# Patient Record
Sex: Male | Born: 1955 | Race: White | Hispanic: No | Marital: Married | Smoking: Never smoker
Health system: Southern US, Community
[De-identification: ages and names within clinical notes are randomized; demographics above are authoritative.]

## PROBLEM LIST (undated history)

## (undated) DIAGNOSIS — R7301 Impaired fasting glucose: Secondary | ICD-10-CM

## (undated) DIAGNOSIS — Z0389 Encounter for observation for other suspected diseases and conditions ruled out: Secondary | ICD-10-CM

## (undated) DIAGNOSIS — R0609 Other forms of dyspnea: Secondary | ICD-10-CM

## (undated) DIAGNOSIS — I10 Essential (primary) hypertension: Secondary | ICD-10-CM

## (undated) DIAGNOSIS — M489 Spondylopathy, unspecified: Secondary | ICD-10-CM

## (undated) DIAGNOSIS — J45909 Unspecified asthma, uncomplicated: Secondary | ICD-10-CM

## (undated) DIAGNOSIS — K589 Irritable bowel syndrome without diarrhea: Secondary | ICD-10-CM

## (undated) DIAGNOSIS — Z1589 Genetic susceptibility to other disease: Secondary | ICD-10-CM

## (undated) DIAGNOSIS — M545 Low back pain, unspecified: Secondary | ICD-10-CM

## (undated) DIAGNOSIS — B0229 Other postherpetic nervous system involvement: Secondary | ICD-10-CM

## (undated) DIAGNOSIS — G4700 Insomnia, unspecified: Secondary | ICD-10-CM

## (undated) DIAGNOSIS — E7800 Pure hypercholesterolemia, unspecified: Secondary | ICD-10-CM

## (undated) DIAGNOSIS — G4730 Sleep apnea, unspecified: Secondary | ICD-10-CM

## (undated) DIAGNOSIS — R202 Paresthesia of skin: Secondary | ICD-10-CM

## (undated) DIAGNOSIS — R42 Dizziness and giddiness: Secondary | ICD-10-CM

## (undated) DIAGNOSIS — IMO0001 Reserved for inherently not codable concepts without codable children: Secondary | ICD-10-CM

## (undated) DIAGNOSIS — K649 Unspecified hemorrhoids: Secondary | ICD-10-CM

## (undated) DIAGNOSIS — M5430 Sciatica, unspecified side: Secondary | ICD-10-CM

## (undated) DIAGNOSIS — Z8679 Personal history of other diseases of the circulatory system: Secondary | ICD-10-CM

## (undated) DIAGNOSIS — E559 Vitamin D deficiency, unspecified: Secondary | ICD-10-CM

## (undated) DIAGNOSIS — K5790 Diverticulosis of intestine, part unspecified, without perforation or abscess without bleeding: Secondary | ICD-10-CM

## (undated) DIAGNOSIS — I491 Atrial premature depolarization: Secondary | ICD-10-CM

## (undated) DIAGNOSIS — G4733 Obstructive sleep apnea (adult) (pediatric): Secondary | ICD-10-CM

## (undated) DIAGNOSIS — I499 Cardiac arrhythmia, unspecified: Secondary | ICD-10-CM

## (undated) DIAGNOSIS — J309 Allergic rhinitis, unspecified: Secondary | ICD-10-CM

## (undated) DIAGNOSIS — N400 Enlarged prostate without lower urinary tract symptoms: Secondary | ICD-10-CM

## (undated) DIAGNOSIS — L570 Actinic keratosis: Secondary | ICD-10-CM

## (undated) HISTORY — DX: Irritable bowel syndrome, unspecified: K58.9

## (undated) HISTORY — DX: Allergic rhinitis, unspecified: J30.9

## (undated) HISTORY — DX: Sciatica, unspecified side: M54.30

## (undated) HISTORY — DX: Unspecified asthma, uncomplicated: J45.909

## (undated) HISTORY — PX: SHOULDER SURGERY: SHX246

## (undated) HISTORY — DX: Other postherpetic nervous system involvement: B02.29

## (undated) HISTORY — PX: HERNIA REPAIR: SHX51

## (undated) HISTORY — DX: Vitamin D deficiency, unspecified: E55.9

## (undated) HISTORY — DX: Paresthesia of skin: R20.2

## (undated) HISTORY — DX: Pure hypercholesterolemia, unspecified: E78.00

## (undated) HISTORY — DX: Personal history of other diseases of the circulatory system: Z86.79

## (undated) HISTORY — DX: Low back pain, unspecified: M54.50

## (undated) HISTORY — DX: Atrial premature depolarization: I49.1

## (undated) HISTORY — DX: Other forms of dyspnea: R06.09

## (undated) HISTORY — DX: Cardiac arrhythmia, unspecified: I49.9

## (undated) HISTORY — DX: Insomnia, unspecified: G47.00

## (undated) HISTORY — DX: Encounter for observation for other suspected diseases and conditions ruled out: Z03.89

## (undated) HISTORY — DX: Genetic susceptibility to other disease: Z15.89

## (undated) HISTORY — DX: Diverticulosis of intestine, part unspecified, without perforation or abscess without bleeding: K57.90

## (undated) HISTORY — DX: Spondylopathy, unspecified: M48.9

## (undated) HISTORY — DX: Unspecified hemorrhoids: K64.9

## (undated) HISTORY — DX: Impaired fasting glucose: R73.01

## (undated) HISTORY — PX: KNEE SURGERY: SHX244

## (undated) HISTORY — DX: Essential (primary) hypertension: I10

## (undated) HISTORY — DX: Benign prostatic hyperplasia without lower urinary tract symptoms: N40.0

## (undated) HISTORY — DX: Dizziness and giddiness: R42

## (undated) HISTORY — DX: Actinic keratosis: L57.0

## (undated) HISTORY — DX: Obstructive sleep apnea (adult) (pediatric): G47.33

## (undated) HISTORY — DX: Sleep apnea, unspecified: G47.30

## (undated) HISTORY — DX: Reserved for inherently not codable concepts without codable children: IMO0001

---

## 1998-10-14 ENCOUNTER — Ambulatory Visit (HOSPITAL_COMMUNITY): Admission: RE | Admit: 1998-10-14 | Discharge: 1998-10-14 | Payer: Self-pay | Admitting: Family Medicine

## 1998-10-14 ENCOUNTER — Encounter: Payer: Self-pay | Admitting: Family Medicine

## 2000-04-17 ENCOUNTER — Encounter: Admission: RE | Admit: 2000-04-17 | Discharge: 2000-05-13 | Payer: Self-pay | Admitting: Family Medicine

## 2000-11-27 ENCOUNTER — Encounter: Admission: RE | Admit: 2000-11-27 | Discharge: 2001-02-25 | Payer: Self-pay | Admitting: *Deleted

## 2001-07-08 ENCOUNTER — Ambulatory Visit (HOSPITAL_COMMUNITY): Admission: RE | Admit: 2001-07-08 | Discharge: 2001-07-08 | Payer: Self-pay | Admitting: Gastroenterology

## 2001-07-08 ENCOUNTER — Encounter (INDEPENDENT_AMBULATORY_CARE_PROVIDER_SITE_OTHER): Payer: Self-pay | Admitting: *Deleted

## 2001-07-17 ENCOUNTER — Encounter: Payer: Self-pay | Admitting: Specialist

## 2001-07-17 ENCOUNTER — Ambulatory Visit (HOSPITAL_COMMUNITY): Admission: RE | Admit: 2001-07-17 | Discharge: 2001-07-17 | Payer: Self-pay | Admitting: Specialist

## 2002-07-04 ENCOUNTER — Ambulatory Visit (HOSPITAL_BASED_OUTPATIENT_CLINIC_OR_DEPARTMENT_OTHER): Admission: RE | Admit: 2002-07-04 | Discharge: 2002-07-04 | Payer: Self-pay | Admitting: *Deleted

## 2002-08-14 ENCOUNTER — Ambulatory Visit (HOSPITAL_BASED_OUTPATIENT_CLINIC_OR_DEPARTMENT_OTHER): Admission: RE | Admit: 2002-08-14 | Discharge: 2002-08-14 | Payer: Self-pay | Admitting: *Deleted

## 2002-12-08 ENCOUNTER — Emergency Department (HOSPITAL_COMMUNITY): Admission: EM | Admit: 2002-12-08 | Discharge: 2002-12-09 | Payer: Self-pay | Admitting: Emergency Medicine

## 2006-04-17 ENCOUNTER — Encounter: Admission: RE | Admit: 2006-04-17 | Discharge: 2006-04-17 | Payer: Self-pay | Admitting: Otolaryngology

## 2007-04-22 ENCOUNTER — Encounter: Admission: RE | Admit: 2007-04-22 | Discharge: 2007-04-22 | Payer: Self-pay | Admitting: Orthopedic Surgery

## 2007-06-27 ENCOUNTER — Other Ambulatory Visit: Admission: RE | Admit: 2007-06-27 | Discharge: 2007-06-27 | Payer: Self-pay | Admitting: Interventional Radiology

## 2007-06-27 ENCOUNTER — Encounter: Admission: RE | Admit: 2007-06-27 | Discharge: 2007-06-27 | Payer: Self-pay | Admitting: Surgery

## 2007-06-27 ENCOUNTER — Encounter (INDEPENDENT_AMBULATORY_CARE_PROVIDER_SITE_OTHER): Payer: Self-pay | Admitting: Interventional Radiology

## 2008-03-11 ENCOUNTER — Encounter: Admission: RE | Admit: 2008-03-11 | Discharge: 2008-04-14 | Payer: Self-pay | Admitting: Family Medicine

## 2008-10-07 ENCOUNTER — Encounter: Payer: Self-pay | Admitting: Emergency Medicine

## 2008-10-07 ENCOUNTER — Inpatient Hospital Stay (HOSPITAL_COMMUNITY): Admission: EM | Admit: 2008-10-07 | Discharge: 2008-10-08 | Payer: Self-pay | Admitting: Cardiology

## 2008-10-07 ENCOUNTER — Ambulatory Visit: Payer: Self-pay | Admitting: Diagnostic Radiology

## 2008-10-15 ENCOUNTER — Encounter: Admission: RE | Admit: 2008-10-15 | Discharge: 2008-10-15 | Payer: Self-pay | Admitting: Gastroenterology

## 2009-03-10 ENCOUNTER — Encounter: Admission: RE | Admit: 2009-03-10 | Discharge: 2009-03-24 | Payer: Self-pay | Admitting: Family Medicine

## 2009-10-04 ENCOUNTER — Encounter: Admission: RE | Admit: 2009-10-04 | Discharge: 2009-10-04 | Payer: Self-pay | Admitting: Orthopedic Surgery

## 2010-04-05 ENCOUNTER — Encounter: Admission: RE | Admit: 2010-04-05 | Discharge: 2010-04-05 | Payer: Self-pay | Admitting: Rheumatology

## 2010-10-18 LAB — COMPREHENSIVE METABOLIC PANEL WITH GFR
ALT: 51 U/L (ref 0–53)
AST: 43 U/L — ABNORMAL HIGH (ref 0–37)
Albumin: 4.2 g/dL (ref 3.5–5.2)
GFR calc Af Amer: >60 mL/min (ref >60)
Potassium: 4.2 meq/L (ref 3.5–5.1)
Total Bilirubin: 0.8 mg/dL (ref 0.3–1.2)
Total Protein: 7.4 g/dL (ref 6.0–8.3)

## 2010-10-18 LAB — DIFFERENTIAL
Basophils Absolute: 0.1 10*3/uL (ref 0.0–0.1)
Basophils Relative: 1 % (ref 0–1)
Eosinophils Absolute: 0.1 10*3/uL (ref 0.0–0.7)
Eosinophils Relative: 1 % (ref 0–5)
Lymphocytes Relative: 20 % (ref 12–46)
Lymphs Abs: 1.5 10*3/uL (ref 0.7–4.0)
Monocytes Absolute: 0.7 10*3/uL (ref 0.1–1.0)
Monocytes Relative: 10 % (ref 3–12)
Neutro Abs: 4.9 10*3/uL (ref 1.7–7.7)
Neutrophils Relative %: 68 % (ref 43–77)

## 2010-10-18 LAB — BASIC METABOLIC PANEL
BUN: 13 mg/dL (ref 6–23)
CO2: 27 mEq/L (ref 19–32)
Calcium: 9 mg/dL (ref 8.4–10.5)
Chloride: 107 mEq/L (ref 96–112)
GFR calc Af Amer: >60 mL/min (ref >60)
GFR calc non Af Amer: >60 mL/min (ref >60)
Glucose, Bld: 100 mg/dL — ABNORMAL HIGH (ref 70–99)

## 2010-10-18 LAB — CARDIAC PANEL(CRET KIN+CKTOT+MB+TROPI)
CK, MB: 5 ng/mL — ABNORMAL HIGH (ref 0.3–4.0)
Relative Index: 2.7 — ABNORMAL HIGH (ref 0.0–2.5)
Relative Index: INVALID (ref 0.0–2.5)
Total CK: 121 U/L (ref 7–232)
Total CK: 83 U/L (ref 7–232)
Troponin I: <0.01 ng/mL (ref 0.00–0.06)
Troponin I: <0.01 ng/mL (ref 0.00–0.06)

## 2010-10-18 LAB — COMPREHENSIVE METABOLIC PANEL
Alkaline Phosphatase: 49 U/L (ref 39–117)
BUN: 12 mg/dL (ref 6–23)
CO2: 25 mEq/L (ref 19–32)
Calcium: 9.1 mg/dL (ref 8.4–10.5)
Chloride: 107 mEq/L (ref 96–112)
Creatinine, Ser: 0.8 mg/dL (ref 0.4–1.5)
GFR calc non Af Amer: >60 mL/min (ref >60)
Glucose, Bld: 100 mg/dL — ABNORMAL HIGH (ref 70–99)
Sodium: 141 mEq/L (ref 135–145)

## 2010-10-18 LAB — POCT CARDIAC MARKERS
CKMB, poc: 4.2 ng/mL (ref 1.0–8.0)
Myoglobin, poc: 70.1 ng/mL (ref 12–200)
Troponin i, poc: <0.05 ng/mL (ref 0.00–0.09)

## 2010-10-18 LAB — CBC
HCT: 39.6 % (ref 39.0–52.0)
HCT: 44.2 % (ref 39.0–52.0)
Hemoglobin: 14.9 g/dL (ref 13.0–17.0)
MCHC: 33.6 g/dL (ref 30.0–36.0)
MCV: 88.4 fL (ref 78.0–100.0)
MCV: 88.7 fL (ref 78.0–100.0)
Platelets: 241 10*3/uL (ref 150–400)
Platelets: 263 K/uL (ref 150–400)
RBC: 4.99 MIL/uL (ref 4.22–5.81)
RDW: 13.6 % (ref 11.5–15.5)
RDW: 12.9 % (ref 11.5–15.5)
WBC: 8.6 10*3/uL (ref 4.0–10.5)
WBC: 7.3 K/uL (ref 4.0–10.5)

## 2010-10-18 LAB — LIPID PANEL
HDL: 33 mg/dL — ABNORMAL LOW (ref >39)
LDL Cholesterol: 94 mg/dL (ref 0–99)
VLDL: 25 mg/dL (ref 0–40)

## 2010-10-18 LAB — APTT: aPTT: 28 s (ref 24–37)

## 2010-10-18 LAB — PROTIME-INR
INR: 1 (ref 0.00–1.49)
Prothrombin Time: 12.8 s (ref 11.6–15.2)

## 2010-10-18 LAB — LIPASE, BLOOD: Lipase: 125 U/L (ref 23–300)

## 2010-11-21 NOTE — H&P (Signed)
[name], [name] NO.:  [id number]   MEDICAL RECORD NO.:  [id number]          PATIENT TYPE:  EMS   LOCATION:  ED                            FACILITY:  [facility]   PHYSICIAN:  Jake Bathe, MD      DATE OF BIRTH:  1955-08-21   DATE OF ADMISSION:  10/07/2008  DATE OF DISCHARGE:  10/07/2008                              HISTORY & PHYSICAL   CARDIOLOGIST:  Francisca December, MD   PRIMARY CARE PHYSICIAN:  Chales Salmon. Douglas Miyamoto, MD   CHIEF COMPLAINT:  Chest pain.   HISTORY OF PRESENT ILLNESS:  A 55 year old male with prior history of  chest pain and in 2004 had a nuclear stress test which showed an  anteroseptal wall reversible defect from base to apex and subsequently  had a cardiac catheterization in 2004 which demonstrated no  angiographically significant coronary artery disease who has a past  medical history of hypertension who is being admitted with chest pain.  Last week at the end of work, he started to develop substernal chest  pain which radiated across his entire chest wall which eventually  worsened in severity to a grade 6/10 and died out at approximately 1 to  2 a.m. that night.  He did not seek medical attention at that time  although he was very close to going to the emergency department close to  his home.  The next day he did have another bout of chest pain although  not quite as severe.  He then saw his allergist, Dr. Corinda Gubler in case  this was related to his asthma and Dr. Corinda Gubler did not feel as though  that was the case.  He then saw Dr. Henrine Screws who set him up for an  appointment at [facility] for further evaluation later next week.  He was prescribed Zegerid for possible GERD.   Earlier today he once again had another bout of substernal chest  discomfort and felt as though he had to sigh to complete a deep breath.  No diaphoresis.  He did have some associated jaw pain with this however  that was different from his prior bouts.  This  worried him enough to  proceed to the [place] [facility] emergency department.  While there,  he had a chest x-ray which was unremarkable, ECG which showed no ST  changes just nonspecific ST-T wave changes.  First set of cardiac  biomarkers were unremarkable.  He also had a lipase which was normal.  All lab work otherwise was unremarkable.   He currently after being transferred to [facility] is chest  pain free, doing quite well.  He stated that after given his  nitroglycerin in the emergency department that he did feel better.   PAST MEDICAL HISTORY:  As described above:  1. Also elevated CK at one point seen by Dr. Coral Spikes of unknown      etiology.  2. Prior leukopenia of unknown etiology.  3. Low back pain, HLA-B27 antigen positive.  4. Hypertension.  5. BPH.  6. Vertigo/dizziness.  7. Irritable bowel syndrome.  8. Degenerative joint disease.  9. Obstructive sleep apnea wearing CPAP mask.   ALLERGIES:  No known drug allergies.   PAST SURGICAL HISTORY:  Hernia repair, knee surgery.   SOCIAL HISTORY:  He is a nonsmoker, rarely drinks alcohol.  He works as  a Medical illustrator.  Tilton Marsalis his mother at age 70 was seen once by me, Dr.  Anne Fu.   FAMILY HISTORY:  His mother had coronary artery disease at a later age  as well as stroke, otherwise no early family history of coronary artery  disease.   REVIEW OF SYSTEMS:  No fevers, no syncope, no rash at home and as stated  above all other 12 review of systems negative.   PHYSICAL EXAMINATION:  VITAL SIGNS:  Blood pressure 151/90 on arrival,  pulse 80, respirations 16, temperature 98.6 on room air 100%.  GENERAL:  Alert and oriented x3 in no acute distress, pleasant, walking  around room without any difficulty.  EYES:  Well-perfused conjunctivae.  EOMI.  No scleral icterus.  NECK: Supple.  No lymphadenopathy.  No JVD, no carotid bruits, full  range of motion of neck.  CARDIOVASCULAR:  Regular rate and rhythm without  any murmurs, rubs or  gallops.  Normal PMI.  LUNGS: Clear to auscultation bilaterally.  No wheezes.  No rales.  Normal respiratory effort.  ABDOMEN:  Mildly obese.  Positive bowel sounds, nontender.  No  hepatosplenomegaly.  No bruits.  EXTREMITIES:  No clubbing, cyanosis or edema.  Normal distal pulses.  NEUROLOGIC:  Nonfocal.  No tremors.  Normal gait.  SKIN:  Warm, dry, and intact.  No rashes.  PSYCH:  Normal affect.   LABORATORY DATA:  As described above unremarkable, reviewed.  AST 43,  ALT 51.  EKG chest x-ray personally reviewed as above.  Nonspecific ST-T  wave changes noted.  Cardiac catheterization from 2004 demonstrated no  angiographically significant coronary artery disease with normal  ejection fraction.   ASSESSMENT AND PLAN:  A 55 year old male with hypertension, mild obesity  with chest pain of increasing frequency concerning for unstable angina.  1. Unstable angina - given his increasing chest pain, frequency as      well as radiation to his jaw we will go ahead and treat his      unstable angina and place him on subcutaneous Lovenox, aspirin, low-      dose beta-blocker 25 mg twice a day, simvastatin 40 mg once a day,      aspirin, oxygen, nitroglycerin as needed.  Certainly, there are      some atypical features to his story however, many of these features      are consistent with angina.  We will continue to cycle cardiac      enzymes overnight.  We will make n.p.o. for morning in case further      procedure is needed.  Depending on the patient's symptoms, EKG in      the morning as well as cardiac biomarkers, we will proceed with      either cardiac catheterization or possible repeat nuclear stress      test.  We will leave the ultimate decision to Dr. Mayford Knife tomorrow      morning.  2. Gastroesophageal reflux disease - start on Protonix here.  3. Hypertension - continue Altace adding low-dose beta-blocker.  4. Benign prostatic hypertrophy - continue Flomax.   5. Obstructive sleep apnea - he says that his wife will bring his CPAP      mask from home.  Jake Bathe, MD  Electronically Signed     MCS/MEDQ  D:  10/07/2008  T:  10/08/2008  Job:  [id number]   cc:   Chales Salmon. [name], M.D.  Francisca December, M.D.

## 2010-11-21 NOTE — Cardiovascular Report (Signed)
[name], [name] NO.:  [id number]   MEDICAL RECORD NO.:  [id number]          PATIENT TYPE:  INP   LOCATION:  2858                         FACILITY:  [facility]   PHYSICIAN:  Armanda Magic, M.D.     DATE OF BIRTH:  01-28-1956   DATE OF PROCEDURE:  10/08/2008  DATE OF DISCHARGE:  10/08/2008                            CARDIAC CATHETERIZATION   REFERRING PHYSICIAN:  Chales Salmon. Abigail Miyamoto, MD   PROCEDURES:  1. Left heart catheterization.  2. Coronary angiography.  3. Left ventriculography.   OPERATOR:  Armanda Magic, MD   INDICATION:  Chest pain.   COMPLICATIONS:  None.   INTRAVENOUS MEDICATIONS:  1. Versed 1 mg.  2. Fentanyl 25 mcg.   This is a 55 year old male, who presented with an episode of chest pain  with nonspecific ST changes.  Because of continued chest pain during the  night, he now presents for cardiac catheterization.   The patient was brought to cardiac catheterization laboratory in a  fasting nonsedated state.  Informed consent was obtained.  The patient  was connected to continuous heart rate and pulse oximetry monitoring and  blood pressure monitor.  The right groin was prepped and draped in  sterile fashion.  A 1% Xylocaine was used for local anesthesia.  Using  the modified Seldinger technique, a 6-French sheath was placed in the  right femoral artery.  Under fluoroscopic guidance, a 6-French JL4  catheter was placed in the left coronary artery.  Multiple cine films  were taken in 30-degree RAO and 40-degree LAO views.  This catheter was  then exchanged out over a guidewire for a 6-French JR4 catheter, which  was placed under fluoroscopic guidance in to the right coronary artery.  Multiple cine films were taken in 30-degree RAO and 40-degree LAO views.  This catheter was then exchanged out over a guidewire for a 6-French  angled pigtail catheter, which was placed under fluoroscopic guidance in  to the left ventricular cavity.  Left  ventriculography was performed in  the 30-degree RAO view using total of 30 mL of contrast at 15 mL/second.  The catheter was then pulled back across the aortic valve with no  significant gradient noted.  At the end of the procedure, all catheters  and sheaths were removed.  Manual compression was performed and adequate  hemostasis was obtained.  The patient was transferred back to room in  stable condition.   The left main coronary artery is widely patent and bifurcates into left  anterior descending artery and left circumflex artery.  The left  anterior descending artery is widely patent throughout its course to the  apex.  It gives rise to a first diagonal branch, which is moderate in  size and widely patent.  It then gives rise to a second diagonal branch,  again moderate in size and widely patent.   Left circumflex traverses the AV groove and is widely patent throughout  its course.  It gives rise to a very high obtuse marginal 1 branch,  which is a large vessel and is widely patent and gives rise to a second  moderate-sized obtuse  marginal 2 branch, which is widely patent and then  distally gives rise to a third obtuse marginal branch, which is widely  patent.   The right coronary artery is widely patent throughout its course and  distally bifurcates into a posterior descending artery and  posterolateral artery, both of which are widely patent.   Left ventriculography shows normal LV function, EF 60% with no regional  wall motion abnormalities.  LV pressure 124/2 mmHg, aortic pressure  113/69 mmHg.   ASSESSMENT:  1. Noncardiac chest pain.  2. Normal coronary arteries.  3. Normal left ventricular function.   PLAN:  Discharge to home after IV fluid and bedrest.  Follow up with Dr.  Matthias Hughs, needs upper endoscopy to rule out possible esophageal spasm,  continue Zegerid.      Armanda Magic, M.D.  Electronically Signed     Armanda Magic, M.D.  Electronically  Signed    TT/MEDQ  D:  02/10/2009  T:  02/11/2009  Job:  [id number]   cc:   Bernette Redbird, M.D.  Dr. Darrick Grinder. Abigail Miyamoto, M.D.

## 2010-11-24 NOTE — Procedures (Signed)
[place]  Patient:    [name], Douglas Visit Number: [id number] MRN: [id number]          Service Type: END Location: ENDO Attending Physician:  Rich Brave Dictated by:   Florencia Reasons, M.D. Proc. Date: 07/08/01 Admit Date:  07/08/2001   CC:         Al Decant. Janey Greaser, M.D.   Procedure Report  PROCEDURE:  Upper endoscopy with biopsies.  INDICATION:  Sporadic diarrhea in a 55 year old otherwise healthy gentleman.  FINDINGS:  Normal exam.  DESCRIPTION OF PROCEDURE:  The patient provided written consent.  Sedation was fentanyl 100 mcg and Versed 10 mg IV prior to and during the course of this procedure and the colonoscopy which followed it.  The Olympus video endoscope was passed under direct vision.  The larynx and vocal cords looked normal. The esophagus was endoscopically normal without evidence of reflux esophagitis, Barretts esophagus, varices, infection, or neoplasia.  There was a small hiatal hernia with a minimal esophageal ring present.  The stomach contained a small clear residual but had normal mucosa other than just a little bit of patchy erythema in the antral region, without erosions, ulcers, polyps, or masses, including a retroflex view of the proximal stomach.  The pylorus, duodenal bulb, and second duodenum looked normal.  The scope was advanced as far as possible into the duodenum, reaching what I believe was the third portion of the duodenum.  The biopsy forceps was then used to obtain multiple duodenal biopsies prior to removal of the scope.  The patient tolerated the procedure well, and there were no apparent complications.  IMPRESSION:  Endoscopically normal exam except for small hiatal hernia and minimal esophageal ring.  PLAN:  Await pathology on biopsies.  The mucosal appearance of the duodenum was normal, so no source of diarrhea was endoscopically evident. Dictated by:   Florencia Reasons,  M.D. Attending Physician:  Rich Brave DD:  07/08/01 TD:  07/08/01 Job: [id number]

## 2010-11-24 NOTE — Discharge Summary (Signed)
NAME:  [name], DARLEY NO.:  [id number]   MEDICAL RECORD NO.:  [id number]           PATIENT TYPE:   LOCATION:                                 FACILITY:   PHYSICIAN:  Armanda Magic, M.D.     DATE OF BIRTH:  1956/05/09   DATE OF ADMISSION:  DATE OF DISCHARGE:                               DISCHARGE SUMMARY   DISCHARGE DIAGNOSES:  1. Noncardiac chest pain.  2. Normal coronary arteries.  3. Normal ejection fraction.  4. Leukopenia.  5. Hypertension.  6  Benign prostatic hypertrophy.  1. Vertigo.  2. Irritable bowel syndrome.  3. Degenerative joint disease.  4. Sleep apnea.   HOSPITAL COURSE:  Mr. Pott is a 55 year old male patient who comes  into the hospital on October 07, 2008, with chest pain.  His cardiac  enzymes were normal.  Lab studies show hemoglobin of 14.9, hematocrit  39.6.  BUN 13, creatinine 0.88.  Ultimately, he did require cardiac  catheterization that showed widely patent vessels.  We thought his chest  pain was noncardiac in nature.  We asked him to follow up with Dr.  Matthias Hughs for possible endoscopy and a question of esophageal spasm.   DISCHARGE INSTRUCTIONS:  Remain on low-sodium heart-healthy diet.  Increase activity slowly.  No driving for 1 week.  No lifting over 10  pounds for 1 week.   DISCHARGE MEDICATIONS:  1. Altace 5 mg 1 a day.  2. Flomax 1 a day.  3. Clarinex p.r.n.  4. Zegerid per home dose.  5. Flonase 1 spray use nostril daily.  6. Astelin nasal spray as needed.   Follow up with Dr. Matthias Hughs.  The patient needs to call for this  appointment.  Follow up with Dr. Amil Amen in 2 weeks.      Guy Franco, P.A.      Armanda Magic, M.D.  Electronically Signed    LB/MEDQ  D:  12/10/2008  T:  12/11/2008  Job:  [id number]   cc:   Francisca December, M.D.  Chales Salmon. Abigail Miyamoto, M.D.  Bernette Redbird, M.D.

## 2012-02-13 ENCOUNTER — Other Ambulatory Visit: Payer: Self-pay | Admitting: Rheumatology

## 2012-02-13 DIAGNOSIS — M25559 Pain in unspecified hip: Secondary | ICD-10-CM

## 2012-02-16 ENCOUNTER — Other Ambulatory Visit: Payer: Self-pay

## 2012-04-18 ENCOUNTER — Ambulatory Visit: Payer: Commercial Indemnity | Attending: Family Medicine | Admitting: *Deleted

## 2012-04-18 DIAGNOSIS — H8110 Benign paroxysmal vertigo, unspecified ear: Secondary | ICD-10-CM | POA: Insufficient documentation

## 2012-04-18 DIAGNOSIS — IMO0001 Reserved for inherently not codable concepts without codable children: Secondary | ICD-10-CM | POA: Insufficient documentation

## 2012-04-21 ENCOUNTER — Ambulatory Visit: Payer: Commercial Indemnity | Admitting: Physical Therapy

## 2012-04-24 ENCOUNTER — Encounter: Payer: Commercial Indemnity | Admitting: Physical Therapy

## 2012-04-29 ENCOUNTER — Ambulatory Visit: Payer: Commercial Indemnity | Admitting: *Deleted

## 2012-05-01 ENCOUNTER — Ambulatory Visit: Payer: Commercial Indemnity | Admitting: *Deleted

## 2012-05-05 ENCOUNTER — Encounter: Payer: Commercial Indemnity | Admitting: *Deleted

## 2012-05-07 ENCOUNTER — Encounter: Payer: Commercial Indemnity | Admitting: *Deleted

## 2012-05-12 ENCOUNTER — Encounter: Payer: Commercial Indemnity | Admitting: *Deleted

## 2012-05-14 ENCOUNTER — Encounter: Payer: Commercial Indemnity | Admitting: *Deleted

## 2012-11-18 ENCOUNTER — Ambulatory Visit: Payer: Commercial Indemnity | Admitting: Rehabilitative and Restorative Service Providers"

## 2012-11-19 ENCOUNTER — Ambulatory Visit: Payer: Commercial Indemnity | Admitting: Rehabilitative and Restorative Service Providers"

## 2012-12-02 ENCOUNTER — Ambulatory Visit: Payer: Commercial Indemnity | Admitting: Rehabilitative and Restorative Service Providers"

## 2015-02-21 ENCOUNTER — Ambulatory Visit (INDEPENDENT_AMBULATORY_CARE_PROVIDER_SITE_OTHER): Payer: Commercial Indemnity | Admitting: Cardiovascular Disease

## 2015-02-21 ENCOUNTER — Encounter: Payer: Self-pay | Admitting: Cardiovascular Disease

## 2015-02-21 ENCOUNTER — Telehealth: Payer: Self-pay | Admitting: Cardiovascular Disease

## 2015-02-21 VITALS — BP 120/80 | HR 76 | Ht 70.0 in | Wt 216.2 lb

## 2015-02-21 DIAGNOSIS — R0600 Dyspnea, unspecified: Secondary | ICD-10-CM

## 2015-02-21 DIAGNOSIS — G4733 Obstructive sleep apnea (adult) (pediatric): Secondary | ICD-10-CM

## 2015-02-21 DIAGNOSIS — R0609 Other forms of dyspnea: Secondary | ICD-10-CM

## 2015-02-21 DIAGNOSIS — I10 Essential (primary) hypertension: Secondary | ICD-10-CM | POA: Insufficient documentation

## 2015-02-21 NOTE — Progress Notes (Signed)
02/21/2015 [name]   May 08, 1956  [id number]  Primary Physician  Douglas Lope, MD Primary Cardiologist: Douglas Gess MD [name]   HPI:  [name] is a delightful 59 year old mild to moderately overweight married Caucasian male father of 3 sons who works doing inside Airline pilot at [employer]. He was referred by Dr. Tenny Craw for cardiovascular evaluation because of mild dyspnea. His only cardiac risk factor is 2 hypertension. He's never had a heart stroke. He did have a clean coronary catheterization 5-6 years ago performed by Dr.  Ty [name] for atypical chest pain. He has occasional atypical chest pain now. His major complaint plate is mild dyspnea on walking up stairs. He does check his blood pressure, runs in the 130/70-80 range.   Current Outpatient Prescriptions  Medication Sig Dispense Refill  . cyclobenzaprine (FLEXERIL) 10 MG tablet Take 10 mg by mouth 3 (three) times daily as needed for muscle spasms.    Marland Kitchen gabapentin (NEURONTIN) 300 MG capsule Take 300 mg by mouth 3 (three) times daily.    . meloxicam (MOBIC) 7.5 MG tablet Take 7.5 mg by mouth 2 (two) times daily.    . ramipril (ALTACE) 5 MG capsule Take 5 mg by mouth daily.    . tamsulosin (FLOMAX) 0.4 MG CAPS capsule Take 0.8 mg by mouth at bedtime.    . zaleplon (SONATA) 10 MG capsule Take 10 mg by mouth at bedtime as needed for sleep.     No current facility-administered medications for this visit.    No Known Allergies  Social History   Social History  . Marital Status: Married    Spouse Name: N/A  . Number of Children: N/A  . Years of Education: N/A   Occupational History  . Not on file.   Social History Main Topics  . Smoking status: Never Smoker   . Smokeless tobacco: Never Used  . Alcohol Use: 0.6 oz/week    1 Standard drinks or equivalent per week  . Drug Use: No  . Sexual Activity: Not on file   Other Topics Concern  . Not on file   Social History Narrative  . No narrative on  file     Review of Systems: General: negative for chills, fever, night sweats or weight changes.  Cardiovascular: negative for chest pain, dyspnea on exertion, edema, orthopnea, palpitations, paroxysmal nocturnal dyspnea or shortness of breath Dermatological: negative for rash Respiratory: negative for cough or wheezing Urologic: negative for hematuria Abdominal: negative for nausea, vomiting, diarrhea, bright red blood per rectum, melena, or hematemesis Neurologic: negative for visual changes, syncope, or dizziness All other systems reviewed and are otherwise negative except as noted above.    Blood pressure 120/80, pulse 76, height 5\' 10"  (1.778 m), weight 216 lb 4 oz (98.09 kg).  General appearance: alert and no distress Neck: no adenopathy, no carotid bruit, no JVD, supple, symmetrical, trachea midline and thyroid not enlarged, symmetric, no tenderness/mass/nodules Lungs: clear to auscultation bilaterally Heart: regular rate and rhythm, S1, S2 normal, no murmur, click, rub or gallop Extremities: extremities normal, atraumatic, no cyanosis or edema  EKG normal sinus rhythm at 76 without ST or T-wave changes. I personally reviewed this EKG  ASSESSMENT AND PLAN:   Essential hypertension History of hypertension with blood pressure measures 120/80 on ramipril. Continue current meds at current dosing  Dyspnea on exertion Patient claims of mild dyspnea on exertion. He is not a smoker. He is mildly overweight. He had a clean coronary cast 5-6 years  ago performed by Dr. Ty [name]. He has no other cardiac risk factors. I do not think his dyspnea is ischemically mediated but more likely related to deconditioning and being mildly overweight. No further cardiovascular evaluation is warranted at this time.      Douglas Gess MD FACP,FACC,FAHA, [facility] 02/21/2015 1:55 PM

## 2015-02-21 NOTE — Assessment & Plan Note (Signed)
History of obstructive sleep apnea on C Pap for many years which he benefits from.

## 2015-02-21 NOTE — Patient Instructions (Signed)
Dr Berry has recommended that you follow-up with him as needed. 

## 2015-02-21 NOTE — Telephone Encounter (Signed)
Received records from [facility] [employer] for appointment on 02/21/15 with Dr Berry.  Records given to N Hines (medical records) for Dr Berry's schedule on 02/21/15. lp °

## 2015-02-21 NOTE — Assessment & Plan Note (Signed)
Patient claims of mild dyspnea on exertion. He is not a smoker. He is mildly overweight. He had a clean coronary cast 5-6 years ago performed by Dr. Ty Hilts. He has no other cardiac risk factors. I do not think his dyspnea is ischemically mediated but more likely related to deconditioning and being mildly overweight. No further cardiovascular evaluation is warranted at this time.

## 2015-02-21 NOTE — Assessment & Plan Note (Signed)
History of hypertension with blood pressure measures 120/80 on ramipril. Continue current meds at current dosing

## 2015-03-09 ENCOUNTER — Ambulatory Visit: Payer: Commercial Indemnity | Admitting: Cardiology

## 2015-08-18 ENCOUNTER — Ambulatory Visit: Payer: Managed Care, Other (non HMO) | Attending: Family Medicine | Admitting: Physical Therapy

## 2015-08-18 ENCOUNTER — Encounter: Payer: Self-pay | Admitting: Physical Therapy

## 2015-08-18 DIAGNOSIS — H8111 Benign paroxysmal vertigo, right ear: Secondary | ICD-10-CM

## 2015-08-21 NOTE — Therapy (Signed)
[facility] [address] [place], [place], [address] Phone: [phone]   Fax:  [phone]  Physical Therapy Evaluation  Patient Details  Name: CARROLL LINGELBACH MRN: [id number] Date of Birth: 27-Jun-1956 Referring Provider: Dr. Gildardo Cranker  Encounter Date: 08/18/2015      PT End of Session - 08/21/15 2020    Visit Number 1   Number of Visits 4   Date for PT Re-Evaluation 09/18/15   Authorization Type [employer]   PT Start Time 1535   PT Stop Time 1621   PT Time Calculation (min) 46 min      Past Medical History  Diagnosis Date  . Hypertension   . Normal coronary arteries     by cardiac catheterization performed by Dr. Danise Mina approximately 5-6 years ago    Past Surgical History  Procedure Laterality Date  . Knee surgery      4 surgeries  . Shoulder surgery      both   . Hernia repair      2 ingunial, 1 in belly button    There were no vitals filed for this visit.  Visit Diagnosis:  BPPV (benign paroxysmal positional vertigo), right - Plan: PT plan of care cert/re-cert      Subjective Assessment - 08/21/15 2016    Subjective Vertigo started Friday night when he rolled over in bed   Pertinent History h/o BPPV episodes with most recent one in 2014    Patient Stated Goals Resolve the vertigo   Currently in Pain? No/denies            [facility] PT Assessment - 08/21/15 0001    Assessment   Medical Diagnosis BPPV   Referring Provider Dr. Gildardo Cranker   Onset Date/Surgical Date 08/12/15   Prior Therapy --  in 2014 for BPPV - at this facility   Balance Screen   Has the patient fallen in the past 6 months No   Has the patient had a decrease in activity level because of a fear of falling?  No   Is the patient reluctant to leave their home because of a fear of falling?  No      Pt has (+) R horizontal roll test - with R horizontal geotropic nystagmus Pt had horizontal nystagmus with R sidelying  test  Pt was treated with Bar-B-Que roll  X 2 reps for R horizontal canal BPPV  Instructed in habituation exercises - rolling- for R BPPV                     PT Education - 08/21/15 2019    Education provided Yes   Education Details rolling repetitively   [employer]) Educated Patient   Methods Explanation;Demonstration;Handout   Comprehension Verbalized understanding;Returned demonstration             PT Long Term Goals - 08/21/15 2023    PT LONG TERM GOAL #1   Title Pt will report no vertigo with any bed mobility.  09-18-15   Time 4   Period Weeks   Status New   PT LONG TERM GOAL #2   Title Pt will have (-) R horizontal roll test to indicate that R BPPV has resolved.  (09-18-15)   Time 4   Period Weeks   Status New   PT LONG TERM GOAL #3   Title Independent in HEP for habituation prn.  (09-18-15)   Time 4   Period Weeks   Status New  Plan - 08/21/15 2021    Clinical Impression Statement Pt has signs and symptoms consistent with R horizontal canalithiasis   Pt will benefit from skilled therapeutic intervention in order to improve on the following deficits Dizziness   Rehab Potential Good   PT Frequency 2x / week   PT Duration 2 weeks   PT Treatment/Interventions ADLs/Self Care Home Management;Canalith Repostioning;Therapeutic exercise;Therapeutic activities;Functional mobility training;Neuromuscular re-education;Patient/family education   PT Next Visit Plan recheck BPPV - R horizontal canal   PT Home Exercise Plan rolling   Consulted and Agree with Plan of Care Patient         Problem List Patient Active Problem List   Diagnosis Date Noted  . Essential hypertension 02/21/2015  . Dyspnea on exertion 02/21/2015  . Obstructive sleep apnea 02/21/2015    Kary Kos, PT 08/21/2015, 8:30 PM  [facility] [address] [place], [place], [address] Phone:  [phone]   Fax:  [phone]  Name: EHTAN DELFAVERO MRN: [id number] Date of Birth: September 14, 1955

## 2015-08-21 NOTE — Patient Instructions (Signed)
Rolling    With pillow under head, start on back. Roll slowly to right. Hold position until symptoms subside. Roll slowly onto left side. Hold position until symptoms subside. Repeat sequence __5_ times per session. Do __3__ sessions per day.  Copyright  VHI. All rights reserved.   

## 2015-08-22 ENCOUNTER — Ambulatory Visit: Payer: Managed Care, Other (non HMO) | Admitting: Physical Therapy

## 2015-08-22 DIAGNOSIS — H8111 Benign paroxysmal vertigo, right ear: Secondary | ICD-10-CM

## 2015-08-24 ENCOUNTER — Encounter: Payer: Self-pay | Admitting: Physical Therapy

## 2015-08-24 NOTE — Therapy (Signed)
[facility] [address] [place], [place], [address] Phone: [phone]   Fax:  [phone]  Physical Therapy Treatment  Patient Details  Name: [name] MRN: [id number] Date of Birth: August 21, 1955 Referring Provider: Dr. Lona Kettle  Encounter Date: 08/22/2015      PT End of Session - 08/24/15 1017    Visit Number 2   Number of Visits 4   Date for PT Re-Evaluation 09/18/15   Authorization Type [employer]   PT Start Time 8242   PT Stop Time 1655   PT Time Calculation (min) 34 min      Past Medical History  Diagnosis Date  . Hypertension   . Normal coronary arteries     by cardiac catheterization performed by Dr. Agapito Games approximately 5-6 years ago    Past Surgical History  Procedure Laterality Date  . Knee surgery      4 surgeries  . Shoulder surgery      both   . Hernia repair      2 ingunial, 1 in belly button    There were no vitals filed for this visit.  Visit Diagnosis:  BPPV (benign paroxysmal positional vertigo), right      Subjective Assessment - 08/24/15 1015    Subjective Pt states the vertigo is gone but he feels slightly "off balance"   Pertinent History h/o BPPV episodes with most recent one in 2014    Patient Stated Goals Resolve the vertigo   Currently in Pain? No/denies      Dynamic visual acuity test - 2 line difference (WNL's) - line 8 static - line 6 dynamic  Sensory Organization Test score WNL's for composite score of 74/100 (N= 70/100);  All 3 inputs - visual, vestibular, and Somatosensory WNL's Pt had no incr. C/o's vertigo after testing completed and pt stepped off machine without LOB  Pt has (-) horizontal roll test - indicative of resolution of R horizontal canal BPPV   Pt requests to be placed on HOLD x 30 days to determine if BPPV re-occurs - if not, pt will be discharged                      PT Education - 08/24/15 1016    Education  provided Yes   Education Details do gaze stabilization and balance on foam as previously instructed (pt has pictures from previous admission)   Person(s) Educated Patient   Methods Explanation;Demonstration   Comprehension Verbalized understanding             PT Long Term Goals - 08/24/15 1021    PT LONG TERM GOAL #1   Title Pt will report no vertigo with any bed mobility.  09-18-15   Baseline met 08-22-15   Status Achieved   PT LONG TERM GOAL #2   Title Pt will have (-) R horizontal roll test to indicate that R BPPV has resolved.  (09-18-15)   Baseline met 08-22-15   Status Achieved   PT LONG TERM GOAL #3   Title Independent in HEP for habituation prn.  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 - may be decompensated due  to BPPV   Pt will benefit from skilled therapeutic intervention in order to improve on the following deficits Dizziness   Rehab Potential Good   PT Frequency 2x / week   PT Duration 2 weeks   PT Treatment/Interventions ADLs/Self Care Home Management;Canalith Repostioning;Therapeutic exercise;Therapeutic activities;Functional mobility training;Neuromuscular re-education;Patient/family education   PT Next Visit Plan hold for 30 days per pt request - will D/C if another appt not needed   PT Home Exercise Plan gaze stabilization and balance on foam   Consulted and Agree with Plan of Care Patient        Problem List Patient Active Problem List   Diagnosis Date Noted  . Essential hypertension 02/21/2015  . Dyspnea on exertion 02/21/2015  . Obstructive sleep apnea 02/21/2015    Alda Lea, PT 08/24/2015, 10:23 AM  [facility] [address] [place], [place], [address] Phone: [phone]   Fax:  [phone]  Name: [name] MRN: [id number] Date of Birth: [birth year]

## 2017-07-15 DIAGNOSIS — M4690 Unspecified inflammatory spondylopathy, site unspecified: Secondary | ICD-10-CM | POA: Diagnosis not present

## 2017-07-15 DIAGNOSIS — Z1589 Genetic susceptibility to other disease: Secondary | ICD-10-CM | POA: Diagnosis not present

## 2017-07-15 DIAGNOSIS — M150 Primary generalized (osteo)arthritis: Secondary | ICD-10-CM | POA: Diagnosis not present

## 2017-07-15 DIAGNOSIS — R748 Abnormal levels of other serum enzymes: Secondary | ICD-10-CM | POA: Diagnosis not present

## 2017-07-23 DIAGNOSIS — M5417 Radiculopathy, lumbosacral region: Secondary | ICD-10-CM | POA: Diagnosis not present

## 2017-07-23 DIAGNOSIS — M545 Low back pain: Secondary | ICD-10-CM | POA: Diagnosis not present

## 2017-09-13 DIAGNOSIS — M1711 Unilateral primary osteoarthritis, right knee: Secondary | ICD-10-CM | POA: Diagnosis not present

## 2017-12-06 DIAGNOSIS — H6092 Unspecified otitis externa, left ear: Secondary | ICD-10-CM | POA: Diagnosis not present

## 2017-12-18 DIAGNOSIS — M1612 Unilateral primary osteoarthritis, left hip: Secondary | ICD-10-CM | POA: Diagnosis not present

## 2017-12-18 DIAGNOSIS — M545 Low back pain: Secondary | ICD-10-CM | POA: Diagnosis not present

## 2017-12-18 DIAGNOSIS — M5417 Radiculopathy, lumbosacral region: Secondary | ICD-10-CM | POA: Diagnosis not present

## 2017-12-18 DIAGNOSIS — M5136 Other intervertebral disc degeneration, lumbar region: Secondary | ICD-10-CM | POA: Diagnosis not present

## 2018-02-10 DIAGNOSIS — R748 Abnormal levels of other serum enzymes: Secondary | ICD-10-CM | POA: Diagnosis not present

## 2018-02-10 DIAGNOSIS — Z1589 Genetic susceptibility to other disease: Secondary | ICD-10-CM | POA: Diagnosis not present

## 2018-02-10 DIAGNOSIS — M4690 Unspecified inflammatory spondylopathy, site unspecified: Secondary | ICD-10-CM | POA: Diagnosis not present

## 2018-02-10 DIAGNOSIS — M150 Primary generalized (osteo)arthritis: Secondary | ICD-10-CM | POA: Diagnosis not present

## 2018-02-12 DIAGNOSIS — M1711 Unilateral primary osteoarthritis, right knee: Secondary | ICD-10-CM | POA: Diagnosis not present

## 2018-02-14 DIAGNOSIS — G4733 Obstructive sleep apnea (adult) (pediatric): Secondary | ICD-10-CM | POA: Diagnosis not present

## 2018-04-14 DIAGNOSIS — R42 Dizziness and giddiness: Secondary | ICD-10-CM | POA: Diagnosis not present

## 2018-04-14 DIAGNOSIS — J3489 Other specified disorders of nose and nasal sinuses: Secondary | ICD-10-CM | POA: Diagnosis not present

## 2018-04-14 DIAGNOSIS — Z9989 Dependence on other enabling machines and devices: Secondary | ICD-10-CM | POA: Diagnosis not present

## 2018-04-14 DIAGNOSIS — G4733 Obstructive sleep apnea (adult) (pediatric): Secondary | ICD-10-CM | POA: Diagnosis not present

## 2018-04-17 DIAGNOSIS — M25522 Pain in left elbow: Secondary | ICD-10-CM | POA: Diagnosis not present

## 2018-05-20 DIAGNOSIS — M25522 Pain in left elbow: Secondary | ICD-10-CM | POA: Diagnosis not present

## 2018-06-04 DIAGNOSIS — Z0000 Encounter for general adult medical examination without abnormal findings: Secondary | ICD-10-CM | POA: Diagnosis not present

## 2018-06-10 DIAGNOSIS — Z0000 Encounter for general adult medical examination without abnormal findings: Secondary | ICD-10-CM | POA: Diagnosis not present

## 2018-06-10 DIAGNOSIS — I10 Essential (primary) hypertension: Secondary | ICD-10-CM | POA: Diagnosis not present

## 2018-06-10 DIAGNOSIS — Z23 Encounter for immunization: Secondary | ICD-10-CM | POA: Diagnosis not present

## 2018-06-10 DIAGNOSIS — G4700 Insomnia, unspecified: Secondary | ICD-10-CM | POA: Diagnosis not present

## 2018-06-10 DIAGNOSIS — E7800 Pure hypercholesterolemia, unspecified: Secondary | ICD-10-CM | POA: Diagnosis not present

## 2018-06-10 DIAGNOSIS — E559 Vitamin D deficiency, unspecified: Secondary | ICD-10-CM | POA: Diagnosis not present

## 2018-06-30 DIAGNOSIS — M1711 Unilateral primary osteoarthritis, right knee: Secondary | ICD-10-CM | POA: Diagnosis not present

## 2018-07-16 DIAGNOSIS — H66002 Acute suppurative otitis media without spontaneous rupture of ear drum, left ear: Secondary | ICD-10-CM | POA: Diagnosis not present

## 2018-07-25 DIAGNOSIS — H9312 Tinnitus, left ear: Secondary | ICD-10-CM | POA: Diagnosis not present

## 2018-07-25 DIAGNOSIS — H9192 Unspecified hearing loss, left ear: Secondary | ICD-10-CM | POA: Diagnosis not present

## 2018-08-05 DIAGNOSIS — H9192 Unspecified hearing loss, left ear: Secondary | ICD-10-CM | POA: Diagnosis not present

## 2018-08-14 DIAGNOSIS — M150 Primary generalized (osteo)arthritis: Secondary | ICD-10-CM | POA: Diagnosis not present

## 2018-08-14 DIAGNOSIS — M4690 Unspecified inflammatory spondylopathy, site unspecified: Secondary | ICD-10-CM | POA: Diagnosis not present

## 2018-08-14 DIAGNOSIS — Z1589 Genetic susceptibility to other disease: Secondary | ICD-10-CM | POA: Diagnosis not present

## 2018-08-14 DIAGNOSIS — R748 Abnormal levels of other serum enzymes: Secondary | ICD-10-CM | POA: Diagnosis not present

## 2018-09-15 DIAGNOSIS — I499 Cardiac arrhythmia, unspecified: Secondary | ICD-10-CM | POA: Diagnosis not present

## 2018-12-10 DIAGNOSIS — G4700 Insomnia, unspecified: Secondary | ICD-10-CM | POA: Diagnosis not present

## 2018-12-30 DIAGNOSIS — M79602 Pain in left arm: Secondary | ICD-10-CM | POA: Diagnosis not present

## 2018-12-30 DIAGNOSIS — I10 Essential (primary) hypertension: Secondary | ICD-10-CM | POA: Diagnosis not present

## 2019-01-06 ENCOUNTER — Telehealth: Payer: Self-pay

## 2019-01-06 NOTE — Telephone Encounter (Signed)
NOTES ON FILE FROM DR ALAN ROSS [phone], SENT REFERRAL TO SCHEDULING 

## 2019-01-15 DIAGNOSIS — H43812 Vitreous degeneration, left eye: Secondary | ICD-10-CM | POA: Diagnosis not present

## 2019-01-28 NOTE — Progress Notes (Signed)
Cardiology Office Note   Date:  01/29/2019   ID:  [name], DOB 12-Jul-1955, MRN [id number]  PCP:  Daisy Florooss, Charles Alan, MD  Cardiologist:   Rollene RotundaJames Rayshawn Maney, MD Referring:  Daisy Florooss, Charles Alan, MD  Chief Complaint  Patient presents with  . Chest Pain  . Palpitations      History of Present Illness: [name] is a 63 y.o. male who was referred by Daisy Florooss, Charles Alan, MD for evaluation of chest pain.  He had a normal cath years ago.  He was seen by Dr. Allyson SabalBerry in 2016 for dyspnea which was not thought to be ischemic.  He gets occasional left shoulder pain.  This is in his left upper shoulder.  Is been happening probably for years.  Somewhat fleeting.  He cannot bring it on.  He does do some activities such as walking.  He occasionally rides a bike.  He does not bring on chest discomfort with this.  He is not having any neck discomfort.  He has no associated shortness of breath, PND or orthopnea.  He has had no presyncope or syncope.  He was concerned because he has had irregular heartbeats.  He feels like skipping beats.  It might skip a few times in a row.  Is been happening for a few months.  He has a baseline bradycardia which he is always wondered about.  He is not able to bring on the skipping beats.  He might happen with activity or at rest.  He is not describing sustained symptomatic tachyarrhythmias.  Past Medical History:  Diagnosis Date  . Hypertension   . Normal coronary arteries    by cardiac catheterization performed by Dr. Danise MinaJon Edmonds   . Sleep apnea     Past Surgical History:  Procedure Laterality Date  . HERNIA REPAIR     2 ingunial, 1 in belly button  . KNEE SURGERY     4 surgeries  . SHOULDER SURGERY     both      Current Outpatient Medications  Medication Sig Dispense Refill  . Cholecalciferol (VITAMIN D-1000 MAX ST) 25 MCG (1000 UT) tablet Take 1 tablet by mouth as directed.    . loperamide (IMODIUM A-D) 2 MG tablet Take 1 tablet by mouth  as directed.    . meloxicam (MOBIC) 7.5 MG tablet Take 7.5 mg by mouth 2 (two) times daily.    . ramipril (ALTACE) 5 MG capsule Take 5 mg by mouth daily.    . tamsulosin (FLOMAX) 0.4 MG CAPS capsule Take 0.8 mg by mouth at bedtime.    . zaleplon (SONATA) 10 MG capsule Take 10 mg by mouth at bedtime as needed for sleep.     No current facility-administered medications for this visit.     Allergies:   Patient has no known allergies.    Social History:  The patient  reports that he has never smoked. He has never used smokeless tobacco. He reports current alcohol use of about 1.0 standard drinks of alcohol per week. He reports that he does not use drugs.   Family History:  The patient's family history includes Cancer in his father; Diabetes in his maternal grandmother; Heart failure in his mother; Hypertension in his father; Stroke in his father and mother.    ROS:  Please see the history of present illness.   Otherwise, review of systems are positive for none.   All other systems are reviewed and negative.    PHYSICAL EXAM:  VS:  BP 139/83   Pulse (!) 58   Ht 5\' 10"  (1.778 m)   Wt 209 lb (94.8 kg)   SpO2 97%   BMI 29.99 kg/m  , BMI Body mass index is 29.99 kg/m. GENERAL:  Well appearing HEENT:  Pupils equal round and reactive, fundi not visualized, oral mucosa unremarkable NECK:  No jugular venous distention, waveform within normal limits, carotid upstroke brisk and symmetric, no bruits, no thyromegaly LYMPHATICS:  No cervical, inguinal adenopathy LUNGS:  Clear to auscultation bilaterally BACK:  No CVA tenderness CHEST:  Unremarkable HEART:  PMI not displaced or sustained,S1 and S2 within normal limits, no S3, no S4, no clicks, no rubs, no murmurs ABD:  Flat, positive bowel sounds normal in frequency in pitch, no bruits, no rebound, no guarding, no midline pulsatile mass, no hepatomegaly, no splenomegaly EXT:  2 plus pulses throughout, no edema, no cyanosis no clubbing SKIN:  No  rashes no nodules NEURO:  Cranial nerves II through XII grossly intact, motor grossly intact throughout PSYCH:  Cognitively intact, oriented to person place and time    EKG:  EKG is ordered today. The ekg ordered today demonstrates sinus bradycardia, rate 58, axis within normal limits, intervals within normal limits, no acute ST-T wave changes.   Recent Labs: No results found for requested labs within last 8760 hours.    Lipid Panel  Wt Readings from Last 3 Encounters:  01/29/19 209 lb (94.8 kg)  02/21/15 216 lb 4 oz (98.1 kg)      Other studies Reviewed: Additional studies/ records that were reviewed today include: Labs. Review of the above records demonstrates:  Please see elsewhere in the note.     ASSESSMENT AND PLAN:  CHEST PAIN:   His chest pain is somewhat atypical.  However, there are some cardiovascular risk factors.  We plan a coronary calcium score.  PALPITATIONS: We talked about a wearable that he can get I think this would be most prudent.  He has reviewed blood work.  He had normal electrolytes.  I do not see a recent TSH will defer to his primary provider.   Current medicines are reviewed at length with the patient today.  The patient does not have concerns regarding medicines.  The following changes have been made:  no change  Labs/ tests ordered today include:   Orders Placed This Encounter  Procedures  . CT CARDIAC SCORING  . EKG 12-Lead     Disposition:   FU with me as needed.     Signed, Minus Breeding, MD  01/29/2019 3:28 PM    [facility]

## 2019-01-29 ENCOUNTER — Telehealth: Payer: Self-pay | Admitting: Cardiology

## 2019-01-29 ENCOUNTER — Other Ambulatory Visit: Payer: Self-pay

## 2019-01-29 ENCOUNTER — Ambulatory Visit: Payer: BC Managed Care – PPO | Admitting: Cardiology

## 2019-01-29 ENCOUNTER — Encounter: Payer: Self-pay | Admitting: Cardiology

## 2019-01-29 VITALS — BP 139/83 | HR 58 | Ht 70.0 in | Wt 209.0 lb

## 2019-01-29 DIAGNOSIS — Z79899 Other long term (current) drug therapy: Secondary | ICD-10-CM

## 2019-01-29 DIAGNOSIS — R079 Chest pain, unspecified: Secondary | ICD-10-CM

## 2019-01-29 NOTE — Patient Instructions (Signed)
Medication Instructions:  The current medical regimen is effective;  continue present plan and medications as directed. Please refer to the Current Medication list given to you today. If you need a refill on your cardiac medications before your next appointment, please call your pharmacy.  Testing/Procedures: YOU WILL NEED A CALCIUM SCORING THERE WILL BE A CHARGE FOR THIS.  Follow-Up: You will need a follow up appointment FOLLOW UP AS NEEDED-PLEASE CALL IF YOU NEED ANYTHING.  You may see Minus Breeding, MD or one of the following Advanced Practice Providers on your designated Care Team:  Rosaria Ferries, PA-C   Jory Sims, DNP, ANP     At [facility], you and your health needs are our priority.  As part of our continuing mission to provide you with exceptional heart care, we have created designated Provider Care Teams.  These Care Teams include your primary Cardiologist (physician) and Advanced Practice Providers (APPs -  Physician Assistants and Nurse Practitioners) who all work together to provide you with the care you need, when you need it.  Thank you for choosing [facility] at [facility]!!

## 2019-01-29 NOTE — Telephone Encounter (Signed)

## 2019-02-26 DIAGNOSIS — Z1589 Genetic susceptibility to other disease: Secondary | ICD-10-CM | POA: Diagnosis not present

## 2019-02-26 DIAGNOSIS — R682 Dry mouth, unspecified: Secondary | ICD-10-CM | POA: Diagnosis not present

## 2019-02-26 DIAGNOSIS — M2550 Pain in unspecified joint: Secondary | ICD-10-CM | POA: Diagnosis not present

## 2019-02-26 DIAGNOSIS — M4690 Unspecified inflammatory spondylopathy, site unspecified: Secondary | ICD-10-CM | POA: Diagnosis not present

## 2019-02-26 DIAGNOSIS — M150 Primary generalized (osteo)arthritis: Secondary | ICD-10-CM | POA: Diagnosis not present

## 2019-03-04 ENCOUNTER — Encounter (INDEPENDENT_AMBULATORY_CARE_PROVIDER_SITE_OTHER): Payer: Self-pay

## 2019-03-04 ENCOUNTER — Other Ambulatory Visit: Payer: Self-pay

## 2019-03-04 ENCOUNTER — Ambulatory Visit (INDEPENDENT_AMBULATORY_CARE_PROVIDER_SITE_OTHER)
Admission: RE | Admit: 2019-03-04 | Discharge: 2019-03-04 | Disposition: A | Discharge status: Home / Self Care | Payer: Self-pay | Source: Ambulatory Visit | Attending: Cardiology | Admitting: Cardiology

## 2019-03-04 DIAGNOSIS — R079 Chest pain, unspecified: Secondary | ICD-10-CM

## 2019-03-12 DIAGNOSIS — R42 Dizziness and giddiness: Secondary | ICD-10-CM | POA: Diagnosis not present

## 2019-03-12 DIAGNOSIS — R197 Diarrhea, unspecified: Secondary | ICD-10-CM | POA: Diagnosis not present

## 2019-03-12 DIAGNOSIS — R109 Unspecified abdominal pain: Secondary | ICD-10-CM | POA: Diagnosis not present

## 2019-03-26 ENCOUNTER — Other Ambulatory Visit: Payer: Self-pay

## 2019-03-26 ENCOUNTER — Ambulatory Visit: Payer: BC Managed Care – PPO | Attending: Family Medicine | Admitting: Physical Therapy

## 2019-03-26 ENCOUNTER — Encounter: Payer: Self-pay | Admitting: Physical Therapy

## 2019-03-26 DIAGNOSIS — H8112 Benign paroxysmal vertigo, left ear: Secondary | ICD-10-CM | POA: Diagnosis not present

## 2019-03-27 NOTE — Therapy (Signed)
[facility] [address] [place], [place], [address] Phone: [phone]   Fax:  [phone]  Physical Therapy Evaluation  Patient Details  Name: [name] MRN: [id number] Date of Birth: 1956/05/07 Referring Provider (PT): Dr. Daisy [name]  [name]   Encounter Date: 03/26/2019  PT End of Session - 03/27/19 1303    Visit Number  1    Number of Visits  4    Date for PT Re-Evaluation  04/25/19    Authorization Type  BCBS    PT Start Time  1020    PT Stop Time  1103    PT Time Calculation (min)  43 min    Activity Tolerance  Patient tolerated treatment well    Behavior During Therapy  [facility] for tasks assessed/performed       Past Medical History:  Diagnosis Date  . Hypertension   . Normal coronary arteries    by cardiac catheterization performed by Dr. Danise MinaJon [name]   . Sleep apnea     Past Surgical History:  Procedure Laterality Date  . HERNIA REPAIR     2 ingunial, 1 in belly button  . KNEE SURGERY     4 surgeries  . SHOULDER SURGERY     both     There were no vitals filed for this visit.   Subjective Assessment - 03/26/19 1027    Subjective  Pt states vertigo started approx. 3-4 weeks ago; pt states he has noticed some unsteadiness/LOB within past week; pt states he does not feel dizzy as long as he does not move head a certain way    Pertinent History  h/o BPPV episodes    Patient Stated Goals  resolve the vertigo    Currently in Pain?  No/denies         [facility] - 03/27/19 0001      Assessment   Medical Diagnosis  Vertigo    Referring Provider (PT)  Dr. Daisy [name]  [name]    Onset Date/Surgical Date  --   approx. 4 weeks ago     Balance Screen   Has the patient fallen in the past 6 months  No    Has the patient had a decrease in activity level because of a fear of falling?   No    Is the patient reluctant to leave their home because of a fear of falling?   No      Prior  Function   Level of Independence  Independent           Vestibular Assessment - 03/27/19 0001      Vestibular Assessment   General Observation  pt is a 63 yr old gentleman with c/o vertigo that started approx. 4 weeks ago - pt states vertigo is not so severe or bad enough that he can't function but wanted to get treatment before it got worse      Symptom Behavior   Type of Dizziness   Spinning    Frequency of Dizziness  varies depending on the movement    Duration of Dizziness  secs to minutes    Symptom Nature  Positional    Aggravating Factors  Rolling to left;Forward bending;Looking up to the ceiling    Relieving Factors  Lying supine;Head stationary    Progression of Symptoms  Better      Oculomotor Exam   Oculomotor Alignment  Normal    Spontaneous  Absent      Positional  Testing   Dix-Hallpike  Dix-Hallpike Right;Dix-Hallpike Left      Dix-Hallpike Right   Dix-Hallpike Right Duration  none    Dix-Hallpike Right Symptoms  No nystagmus      Dix-Hallpike Left   Dix-Hallpike Left Duration  approx 15 secs    Dix-Hallpike Left Symptoms  No nystagmus      Positional Sensitivities   Rolling Left  Mild dizziness          Objective measurements completed on examination: See above findings.      Epley maneuver for Lt BPPV performed 3 reps - slight improvement reported as minimal nystagmus observed         PT Education - 03/27/19 1301    Education Details  educated in [place] exercises    Person(s) Educated  Patient    Methods  Explanation    Comprehension  Verbalized understanding;Returned demonstration          PT Long Term Goals - 03/27/19 1306      PT LONG TERM GOAL #1   Title  Pt will report no vertigo with any bed mobility.    Time  4    Period  Weeks    Status  New    Target Date  04/25/19      PT LONG TERM GOAL #2   Title  Pt will have (-) Lt Dix-Hallpike test to indicate Lt BPPV has resolved.    Time  4    Period  Weeks     Status  New    Target Date  04/25/19      PT LONG TERM GOAL #3   Title  Independent in HEP for habituation prn.    Time  4    Period  Weeks    Status  New    Target Date  04/25/19             Plan - 03/27/19 1304    Clinical Impression Statement  Pt has signs and symptoms consistent with Lt posterior canalithiasis BPPV; slightly improved with Epley for LT BPPV.    Personal Factors and Comorbidities  Past/Current Experience;Comorbidity 1    Examination-Activity Limitations  Locomotion Level;Reach Overhead    Examination-Participation Restrictions  [employer] Work    Stability/Clinical Decision Making  Stable/Uncomplicated    Clinical Decision Making  Low    Rehab Potential  Good    PT Frequency  1x / week    PT Duration  4 weeks    PT Treatment/Interventions  Canalith Repostioning;ADLs/Self Care Home Management;Neuromuscular re-education;Patient/family education    PT Next Visit Plan  recheck Lt BPPV       Patient will benefit from skilled therapeutic intervention in order to improve the following deficits and impairments:  Dizziness  Visit Diagnosis: BPPV (benign paroxysmal positional vertigo), left - Plan: PT plan of care cert/re-cert     Problem List Patient Active Problem List   Diagnosis Date Noted  . Essential hypertension 02/21/2015  . Dyspnea on exertion 02/21/2015  . Obstructive sleep apnea 02/21/2015    [name], PT 03/27/2019, 1:09 PM  [facility] [address] [place], [place], [address] Phone: [phone]   Fax:  [phone]  Name: [name] MRN: [id number] Date of Birth: [birth year]

## 2019-03-31 ENCOUNTER — Ambulatory Visit: Payer: BC Managed Care – PPO | Admitting: Physical Therapy

## 2019-05-01 DIAGNOSIS — G4733 Obstructive sleep apnea (adult) (pediatric): Secondary | ICD-10-CM | POA: Diagnosis not present

## 2019-05-01 DIAGNOSIS — H608X2 Other otitis externa, left ear: Secondary | ICD-10-CM | POA: Diagnosis not present

## 2019-05-01 DIAGNOSIS — H8110 Benign paroxysmal vertigo, unspecified ear: Secondary | ICD-10-CM | POA: Diagnosis not present

## 2019-05-01 DIAGNOSIS — H9193 Unspecified hearing loss, bilateral: Secondary | ICD-10-CM | POA: Diagnosis not present

## 2019-05-06 DIAGNOSIS — M1711 Unilateral primary osteoarthritis, right knee: Secondary | ICD-10-CM | POA: Diagnosis not present

## 2019-07-24 DIAGNOSIS — K580 Irritable bowel syndrome with diarrhea: Secondary | ICD-10-CM | POA: Diagnosis not present

## 2019-08-31 DIAGNOSIS — Z1589 Genetic susceptibility to other disease: Secondary | ICD-10-CM | POA: Diagnosis not present

## 2019-08-31 DIAGNOSIS — M4690 Unspecified inflammatory spondylopathy, site unspecified: Secondary | ICD-10-CM | POA: Diagnosis not present

## 2019-08-31 DIAGNOSIS — M2550 Pain in unspecified joint: Secondary | ICD-10-CM | POA: Diagnosis not present

## 2019-08-31 DIAGNOSIS — M150 Primary generalized (osteo)arthritis: Secondary | ICD-10-CM | POA: Diagnosis not present

## 2019-09-07 DIAGNOSIS — R519 Headache, unspecified: Secondary | ICD-10-CM | POA: Diagnosis not present

## 2019-09-07 DIAGNOSIS — R42 Dizziness and giddiness: Secondary | ICD-10-CM | POA: Diagnosis not present

## 2019-09-09 DIAGNOSIS — R519 Headache, unspecified: Secondary | ICD-10-CM | POA: Diagnosis not present

## 2019-09-17 ENCOUNTER — Ambulatory Visit: Payer: BC Managed Care – PPO | Attending: Family Medicine | Admitting: Physical Therapy

## 2019-09-17 ENCOUNTER — Encounter: Payer: Self-pay | Admitting: Physical Therapy

## 2019-09-17 ENCOUNTER — Other Ambulatory Visit: Payer: Self-pay

## 2019-09-17 VITALS — BP 130/82 | HR 57

## 2019-09-17 DIAGNOSIS — R42 Dizziness and giddiness: Secondary | ICD-10-CM | POA: Insufficient documentation

## 2019-09-17 DIAGNOSIS — R2681 Unsteadiness on feet: Secondary | ICD-10-CM | POA: Insufficient documentation

## 2019-09-17 DIAGNOSIS — H8112 Benign paroxysmal vertigo, left ear: Secondary | ICD-10-CM | POA: Diagnosis not present

## 2019-09-18 NOTE — Therapy (Signed)
[facility] [address] [place], [place], [address] Phone: [phone]   Fax:  [phone]  Physical Therapy Evaluation  Patient Details  Name: [name] MRN: [id number] Date of Birth: April 22, 1956 Referring Provider (PT): Dr. Daisy Floro   Encounter Date: 09/17/2019  PT End of Session - 09/18/19 1033    Visit Number  1    Number of Visits  4    Date for PT Re-Evaluation  10/18/19    Authorization Type  BCBS    PT Start Time  0930    PT Stop Time  1005    PT Time Calculation (min)  35 min       Past Medical History:  Diagnosis Date  . Hypertension   . Normal coronary arteries    by cardiac catheterization performed by Dr. Danise Mina   . Sleep apnea     Past Surgical History:  Procedure Laterality Date  . HERNIA REPAIR     2 ingunial, 1 in belly button  . KNEE SURGERY     4 surgeries  . SHOULDER SURGERY     both     Vitals:   09/17/19 1005  BP: 130/82  Pulse: (!) 57     Subjective Assessment - 09/17/19 0935    Subjective  Pt reports this episode of vertigo started approx. a month ago; denies nausea or vomiting accompanying it.  Pt states he was having a bad headache when he called about 3 weeks ago to get this PT appt.  States he has localized headache at this time on left side of his head.    Pertinent History  h/o BPPV episodes; was last treated in Sept. 2020    Patient Stated Goals  resolve the vertigo    Currently in Pain?  Other (Comment)   headache at present time - dull ache        [facility] PT Assessment - 09/18/19 0001      Assessment   Medical Diagnosis  BPPV    Referring Provider (PT)  Dr. Daisy Floro    Onset Date/Surgical Date  --   approx. 1 month ago   Prior Therapy  pt was treated for BPPV at this facility in Sept. 2020      Precautions   Precautions  None      Balance Screen   Has the patient fallen in the past 6 months  No    Has the patient had a  decrease in activity level because of a fear of falling?   No    Is the patient reluctant to leave their home because of a fear of falling?   No      Prior Function   Level of Independence  Independent           Vestibular Assessment - 09/18/19 0001      Vestibular Assessment   General Observation  Pt is a 64 yr old gentleman with c/o episodic vertigo (BPPV) that has re-occurred since previous episode in Sept. 2020.  Pt reports this episode started approx. 4 weeks ago; he reports spinning sensation with looking up and with certain head movements and also with rolling in bed.  Pt denies nausea and vomiting with vertigo.       Symptom Behavior   Type of Dizziness   Spinning    Frequency of Dizziness  varies depending on the movement    Duration of Dizziness  secs to minutes  Symptom Nature  Positional    Aggravating Factors  Rolling to left;Forward bending;Looking up to the ceiling    Relieving Factors  Lying supine;Head stationary    Progression of Symptoms  Better      Oculomotor Exam   Oculomotor Alignment  Normal    Spontaneous  Absent      Positional Testing   Dix-Hallpike  Dix-Hallpike Right;Dix-Hallpike Left    Sidelying Test  Sidelying Right;Sidelying Left      Dix-Hallpike Right   Dix-Hallpike Right Duration  none    Dix-Hallpike Right Symptoms  No nystagmus      Dix-Hallpike Left   Dix-Hallpike Left Duration  none    Dix-Hallpike Left Symptoms  No nystagmus      Sidelying Right   Sidelying Right Duration  none    Sidelying Right Symptoms  No nystagmus      Sidelying Left   Sidelying Left Duration  none    Sidelying Left Symptoms  No nystagmus      Positional Sensitivities   Sit to Supine  No dizziness    Supine to Left Side  No dizziness    Supine to Right Side  No dizziness    Supine to Sitting  Mild dizziness    Right Hallpike  No dizziness    Up from Right Hallpike  No dizziness    Up from Left Hallpike  Lightheadedness    Head Turning x 5  No  dizziness    Head Nodding x 5  No dizziness    Rolling Right  No dizziness    Rolling Left  No dizziness    Positional Sensitivities Comments  up from Lt sidelying - mild dizziness but only lasting 2-3 secs           Objective measurements completed on examination: See above findings.              PT Education - 09/18/19 1031    Education Details  reviewed Epley for self treatment prn; eval results - it appears BPPV has either resolved or symptoms are being masked by pt having taken Valium last evening (took Valium Wed. @ 7:00 pm with PT eval @ 9:30 AM)    Person(s) Educated  Patient    Methods  Explanation    Comprehension  Verbalized understanding          PT Long Term Goals - 09/18/19 1041      PT LONG TERM GOAL #1   Title  Pt will report no vertigo with any bed mobility.    Time  4    Target Date  10/19/19      PT LONG TERM GOAL #2   Title  Pt will have (-) Lt Dix-Hallpike test to indicate Lt BPPV has resolved.    Time  4    Period  Weeks    Status  New    Target Date  10/19/19      PT LONG TERM GOAL #3   Title  Independent in HEP for habituation prn.    Time  4    Period  Weeks    Status  New    Target Date  10/19/19      PT LONG TERM GOAL #4   Title  Pt will amb. 25' with head turns horizontally with no c/o vertigo and no veering off path to demo improved balance.    Time  4    Period  Weeks    Status  New  Target Date  10/19/19             Plan - 09/18/19 1034    Clinical Impression Statement  Pt has no signs or symptoms of BPPV at this time with no nystagmus noted and no vertigo provoked with any positional testing.  Pt did c/o light-headedness/dysequilibrium with return to upright sitting position from Lt sidelying position, but no nystagmus was noted.  Pt did report he had taken Valium at 7:00 previous evening prior to initial eval, so possibly symptoms being masked by this medication.  Pt did exhibit some mild postural instability  at end of eval with walking from treatment room to front lobby with pt veering off toward Rt side.  Pt was advised to withhold Valium medication prior to next appt on 09-21-19 for further accurate assessment of vertigo.    Personal Factors and Comorbidities  Past/Current Experience;Comorbidity 1    Examination-Activity Limitations  Locomotion Level;Reach Overhead;Bed Mobility;Bend    Examination-Participation Restrictions  [employer] Activity;Shop;Other   work activities   Stability/Clinical Decision Making  Stable/Uncomplicated    Clinical Decision Making  Low    Rehab Potential  Good    PT Frequency  1x / week    PT Duration  4 weeks    PT Treatment/Interventions  Canalith Repostioning;ADLs/Self Care Home Management;Neuromuscular re-education;Patient/family education;Balance training;Therapeutic exercise;Therapeutic activities;Vestibular    PT Next Visit Plan  recheck Lt BPPV - give balance on foam    Consulted and Agree with Plan of Care  Patient       Patient will benefit from skilled therapeutic intervention in order to improve the following deficits and impairments:  Dizziness, Decreased balance, Difficulty walking  Visit Diagnosis: BPPV (benign paroxysmal positional vertigo), left - Plan: PT plan of care cert/re-cert  Dizziness and giddiness - Plan: PT plan of care cert/re-cert  Unsteadiness on feet - Plan: PT plan of care cert/re-cert     Problem List Patient Active Problem List   Diagnosis Date Noted  . Essential hypertension 02/21/2015  . Dyspnea on exertion 02/21/2015  . Obstructive sleep apnea 02/21/2015    Kary Kos, PT 09/18/2019, 10:48 AM  [facility] [address] [place], [place], [address] Phone: [phone]   Fax:  [phone]  Name: [name] MRN: [id number] Date of Birth: [birth year]

## 2019-09-21 ENCOUNTER — Ambulatory Visit: Payer: BC Managed Care – PPO | Admitting: Physical Therapy

## 2019-09-21 ENCOUNTER — Encounter: Payer: Self-pay | Admitting: Physical Therapy

## 2019-09-21 ENCOUNTER — Other Ambulatory Visit: Payer: Self-pay

## 2019-09-21 DIAGNOSIS — R42 Dizziness and giddiness: Secondary | ICD-10-CM | POA: Diagnosis not present

## 2019-09-21 DIAGNOSIS — H8112 Benign paroxysmal vertigo, left ear: Secondary | ICD-10-CM | POA: Diagnosis not present

## 2019-09-21 DIAGNOSIS — R2681 Unsteadiness on feet: Secondary | ICD-10-CM | POA: Diagnosis not present

## 2019-09-21 NOTE — Therapy (Signed)
[facility] [address] [place], [place], [address] Phone: [phone]   Fax:  [phone]  Physical Therapy Treatment & Discharge Summary   Patient Details  Name: [name] MRN: [id number] Date of Birth: 1956-02-20 Referring Provider (PT): Dr. Lawerance Cruel   Encounter Date: 09/21/2019  PT End of Session - 09/21/19 1527    Visit Number  2    Number of Visits  4    Date for PT Re-Evaluation  10/18/19    Authorization Type  [employer]    PT Start Time  0800    PT Stop Time  0820   session ended early - vertigo had resolved   PT Time Calculation (min)  20 min       Past Medical History:  Diagnosis Date  . Hypertension   . Normal coronary arteries    by cardiac catheterization performed by Dr. Agapito Games   . Sleep apnea     Past Surgical History:  Procedure Laterality Date  . HERNIA REPAIR     2 ingunial, 1 in belly button  . KNEE SURGERY     4 surgeries  . SHOULDER SURGERY     both     There were no vitals filed for this visit.  Subjective Assessment - 09/21/19 0803    Subjective  Pt reports he has not had any spinning vertigo since last Thursday (time of initial eval) but states he did have left ear pain after eval on Thursday;pt reports he took some Benadryl - wonders if some of his dizziness was due to allergies/sinus problems -  felt better over the weekend    Pertinent History  h/o BPPV episodes; was last treated in Sept. 2020    Patient Stated Goals  resolve the vertigo    Currently in Pain?  No/denies         Lt and Rt Dix-Hallpike tests (-) with no nystagmus and no c/o vertigo  Rt and Lt sidelying tests (-) with no nystagmus and no c/o vertigo; pt able to transfer from Rt & Lt sidelying positions to upright seated position with No c/o vertigo  Reviewed Brandt-Daroff and Epley for self management for treatment prn should another episode of vertigo re-occur in the  future                      PT Education - 09/21/19 1525    Education Details  Reviewed Epley and Brandt-Daroff exercises for self-treatment prn should another episode of BPPV re-occur    Person(s) Educated  Patient    Methods  Explanation    Comprehension  Verbalized understanding          PT Long Term Goals - 09/21/19 1532      PT LONG TERM GOAL #1   Title  Pt will report no vertigo with any bed mobility.    Baseline  met 09-21-19    Time  4    Status  Achieved      PT LONG TERM GOAL #2   Title  Pt will have (-) Lt Dix-Hallpike test to indicate Lt BPPV has resolved.    Baseline  met 09-21-19    Time  4    Period  Weeks    Status  Achieved      PT LONG TERM GOAL #3   Title  Independent in HEP for habituation prn.    Baseline  met 09-21-19    Time  4  Period  Weeks    Status  Achieved      PT LONG TERM GOAL #4   Title  Pt will amb. 25' with head turns horizontally with no c/o vertigo and no veering off path to demo improved balance.    Baseline  met 09-21-19    Time  4    Period  Weeks    Status  Achieved            Plan - 09/21/19 1527    Clinical Impression Statement  Pt reported no vertigo with any positional testing with no nystagmus and no vertigo reported with Rt or Lt Dix-Hallpike tests or with sidelying tests.  Vertigo appears to be fully resolved and pt reports no significant swaying or LOB since time of initial eval last Thursday.  Pt attributes some of dizziness/dysequilibrium to effects of allergies; pt does state he no longer has any spinning vertigo.  All LTG's met - pt is discharged due to vertigo resolved at this time.    Personal Factors and Comorbidities  Past/Current Experience;Comorbidity 1    Examination-Activity Limitations  Locomotion Level;Reach Overhead;Bed Mobility;Bend    Examination-Participation Restrictions  [employer] Activity;Shop;Other   work activities   Stability/Clinical Decision Making   Stable/Uncomplicated    Rehab Potential  Good    PT Frequency  1x / week    PT Duration  4 weeks    PT Treatment/Interventions  Canalith Repostioning;ADLs/Self Care Home Management;Neuromuscular re-education;Patient/family education;Balance training;Therapeutic exercise;Therapeutic activities;Vestibular    PT Next Visit Plan  D/C on 09-21-19    Consulted and Agree with Plan of Care  Patient       Patient will benefit from skilled therapeutic intervention in order to improve the following deficits and impairments:  Dizziness, Decreased balance, Difficulty walking  Visit Diagnosis: BPPV (benign paroxysmal positional vertigo), left     Problem List Patient Active Problem List   Diagnosis Date Noted  . Essential hypertension 02/21/2015  . Dyspnea on exertion 02/21/2015  . Obstructive sleep apnea 02/21/2015      PHYSICAL THERAPY DISCHARGE SUMMARY  Visits from Start of Care: 2  Current functional level related to goals / functional outcomes: See above for progress towards goals - all goals met - BPPV has resolved as of this time   Remaining deficits: None regarding vertigo at present time   Education / Equipment: Pt was instructed in Brandt-Daroff exercises and Epley maneuver was reviewed with pt (this info had been given in previous admission) Recommended pt to continue to drink plenty of water for hydration Discussed symptom of true room spinning vertigo (etiology BPPV) vs. Dizziness/dysequilibrium which could possibly be due to allergies or sinus problem Plan: Patient agrees to discharge.  Patient goals were met. Patient is being discharged due to meeting the stated rehab goals.  ?????        Alda Lea, PT 09/21/2019, 3:37 PM  [facility] [address] [place], [place], [address] Phone: [phone]   Fax:  [phone]  Name: [name] MRN: [id number] Date of Birth: [birth year]

## 2019-09-24 DIAGNOSIS — M25512 Pain in left shoulder: Secondary | ICD-10-CM | POA: Diagnosis not present

## 2019-10-20 DIAGNOSIS — G4733 Obstructive sleep apnea (adult) (pediatric): Secondary | ICD-10-CM | POA: Diagnosis not present

## 2019-10-22 DIAGNOSIS — M7542 Impingement syndrome of left shoulder: Secondary | ICD-10-CM | POA: Diagnosis not present

## 2019-10-28 DIAGNOSIS — M5417 Radiculopathy, lumbosacral region: Secondary | ICD-10-CM | POA: Diagnosis not present

## 2019-10-28 DIAGNOSIS — M545 Low back pain: Secondary | ICD-10-CM | POA: Diagnosis not present

## 2019-11-27 DIAGNOSIS — E559 Vitamin D deficiency, unspecified: Secondary | ICD-10-CM | POA: Diagnosis not present

## 2019-11-27 DIAGNOSIS — R251 Tremor, unspecified: Secondary | ICD-10-CM | POA: Diagnosis not present

## 2019-12-01 DIAGNOSIS — M5417 Radiculopathy, lumbosacral region: Secondary | ICD-10-CM | POA: Diagnosis not present

## 2019-12-01 DIAGNOSIS — M545 Low back pain: Secondary | ICD-10-CM | POA: Diagnosis not present

## 2019-12-04 ENCOUNTER — Encounter: Payer: Self-pay | Admitting: Neurology

## 2019-12-18 NOTE — Progress Notes (Signed)
Assessment/Plan:   1.  Essential Tremor.  -his is asymmetric (seen in 30% of cases) but his is also longstanding hx of gradually getting worse.  We discussed nature and pathophysiology.  We discussed that this can continue to gradually get worse with time.  We discussed that some medications can worsen this, as can caffeine use.  We discussed medication therapy as well as surgical therapy.  Ultimately, the patient decided to hold off on medication for now since tremor is not that bothersome.    -pt to have labs from PCP (did call office for labs avail but not received yet).  He is set to have labs next week.  If not already done so, recommend checking TSH.  2.  Word finding trouble  -no evidence of neurodegen process  -if gets worse, discussed neurocog testing  3.  F/u prn   Subjective:   [name] was seen today in the movement disorders clinic for neurologic consultation at the request of Lawerance Cruel, MD.  The consultation is for the evaluation of tremor.  Outside records that were made available to me were reviewed.  Primary care recently started on propranolol ER 60 mg daily on 11/27/19 and pt states that he took it for less than a week.  He didn't find that it helped and he stopped it.  No SE.  Notes occasional substitution of one word for another.    Tremor: Yes.     How long has it been going on? 20 years  At rest or with activation?  activation  When is it noted the most?  Doing paperwork; using phone; fine motor coordination  Fam hx of tremor?  No.  Located where?  Mostly L hand (he is L hand dominant for writing)  Affected by caffeine:  No. (drinks 1 large mug coffee per day)  Affected by alcohol:  Doesn't drink enough to know (drinks max 1 beer/month)  Affected by stress:  No./ doesn't know - "always stressed"  Affected by fatigue:  No.  Spills soup if on spoon:  No.  Spills glass of liquid if full:  No.  Affects ADL's (tying shoes, brushing teeth, etc):   No.  Tremor inducing meds:  No.  Other Specific Symptoms:  Voice: no change Sleep: trouble getting and staying asleep Postural symptoms:  Yes.    Falls?  No. Loss of smell:  No. Loss of taste:  No. Urinary Incontinence:  No. Difficulty Swallowing:  No. Handwriting, micrographia: No. Depression:  No. but some anxiety N/V:  No. Lightheaded:  No.  Syncope: No. Diplopia:  No. Dyskinesia:  No.  Neuroimaging of the brain has previously been performed.  It is not available for my review today.  Reviewed old scan from 2009 that he brought with him.  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  PREVIOUS MEDICATIONS: inderal LA, 60 mg (didn't take long)  ALLERGIES:  No Known Allergies  CURRENT MEDICATIONS:  Current Outpatient Medications  Medication Instructions  . Cholecalciferol (VITAMIN D-1000 MAX ST) 25 MCG (1000 UT) tablet 1 tablet, Oral, As directed  . Eszopiclone 3 MG TABS 1 tablet, Oral, Daily at bedtime  . loperamide (IMODIUM A-D) 2 MG tablet 1 tablet, Oral, As directed  . meloxicam (MOBIC) 7.5 mg, Oral, 2 times daily  . ramipril (ALTACE) 5 mg, Oral, Daily  . tamsulosin (FLOMAX) 0.8 mg, Oral, Daily at bedtime  . zaleplon (SONATA) 10  mg, Oral, At bedtime PRN    Objective:   PHYSICAL EXAMINATION:    VITALS:   Vitals:   12/23/19 0830  BP: (!) 142/89  Pulse: 67  SpO2: 97%  Weight: 210 lb (95.3 kg)  Height: 5\' 10"  (1.778 m)    GEN:  The patient appears stated age and is in NAD. HEENT:  Normocephalic, atraumatic.  The mucous membranes are moist. The superficial temporal arteries are without ropiness or tenderness. CV:  RRR Lungs:  CTAB Neck/HEME:  There are no carotid bruits bilaterally.  Neurological examination:  Orientation: The patient is alert and oriented x3.  Cranial nerves: There is good facial symmetry.  Extraocular muscles are intact. The visual fields are full to confrontational testing. The  speech is fluent and clear. Soft palate rises symmetrically and there is no tongue deviation. Hearing is intact to conversational tone. Sensation: Sensation is intact to light touch throughout (facial, trunk, extremities). Vibration is intact at the bilateral big toe. There is no extinction with double simultaneous stimulation.  Motor: Strength is 5/5 in the bilateral upper and lower extremities.   Shoulder shrug is equal and symmetric.  There is no pronator drift. Deep tendon reflexes: Deep tendon reflexes are 2/4 at the bilateral biceps, triceps, brachioradialis, patella and achilles. Plantar responses are downgoing bilaterally.  Movement examination: Tone: There is nl tone in the bilateral upper extremities.  The tone in the lower extremities is nl.  Abnormal movements: no rest tremor.  No sig intention tremor.  There is postural tremor on the L only.  Has tremor with weight on the L.  He has tremor with archimedes spirals on the L.  he has no difficulty when asked to pour a full glass of water from one glass to another. Coordination:  There is no decremation with RAM's, with any form of RAMS, including alternating supination and pronation of the forearm, hand opening and closing, finger taps, heel taps and toe taps. Gait and Station: The patient has no difficulty arising out of a deep-seated chair without the use of the hands. The patient's stride length is good.  He can ambulate in a tandem medication.    I have reviewed and interpreted the following labs independently   Chemistry      Component Value Date/Time   NA 141 10/08/2008 0320   K 3.8 10/08/2008 0320   CL 107 10/08/2008 0320   CO2 27 10/08/2008 0320   BUN 13 10/08/2008 0320   CREATININE 0.88 10/08/2008 0320      Component Value Date/Time   CALCIUM 9.0 10/08/2008 0320   ALKPHOS 49 10/07/2008 1552   AST 43 (H) 10/07/2008 1552   ALT 51 10/07/2008 1552   BILITOT 0.8 10/07/2008 1552      No results found for: TSH Lab Results    Component Value Date   WBC 8.6 10/08/2008   HGB 14.0 10/08/2008   HCT 39.6 10/08/2008   MCV 88.4 10/08/2008   PLT 241 10/08/2008      Total time spent on today's visit was 45 minutes, including both face-to-face time and nonface-to-face time.  Time included that spent on review of records (prior notes available to me/labs/imaging if pertinent), discussing treatment and goals, answering patient's questions and coordinating care.  Cc:  12/08/2008, MD

## 2019-12-23 ENCOUNTER — Ambulatory Visit: Payer: BC Managed Care – PPO | Admitting: Neurology

## 2019-12-23 ENCOUNTER — Other Ambulatory Visit: Payer: Self-pay

## 2019-12-23 ENCOUNTER — Encounter: Payer: Self-pay | Admitting: Neurology

## 2019-12-23 VITALS — BP 142/89 | HR 67 | Ht 70.0 in | Wt 210.0 lb

## 2019-12-23 DIAGNOSIS — G250 Essential tremor: Secondary | ICD-10-CM | POA: Diagnosis not present

## 2019-12-23 NOTE — Patient Instructions (Signed)
Let us know if you need [place] in the future or if tremor changes.  I would recommend you make sure that you have your thyroid checked if not already done so.  The physicians and staff at [facility] are committed to providing excellent care. You may receive a survey requesting feedback about your experience at our office. We strive to receive "very good" responses to the survey questions. If you feel that your experience would prevent you from giving the office a "very good " response, please contact our office to try to remedy the situation. We may be reached at [phone]. Thank you for taking the time out of your busy day to complete the survey.

## 2020-02-18 IMAGING — CT CT HEART SCORING
2 series · 16 of 20 positions shown, 18 images · non-contrast
Comparison: None.
COMPARISON: None.

Addendum:
EXAM:
OVER-READ INTERPRETATION  CT CHEST

The following report is an over-read performed by radiologist Dr.
Drahimov Decorlar [REDACTED] on 03/04/2019. This
over-read does not include interpretation of cardiac or coronary
anatomy or pathology. The coronary calcium score interpretation by
the cardiologist is attached.
CLINICAL DATA: Risk stratification
Coronary Calcium Score
TECHNIQUE: The patient was scanned on a Siemens Force scanner. Axial
non-contrast 3 mm slices were carried out through the heart. The
data set was analyzed on a dedicated work station and scored using
the Agatson method.

[Series 2: casc 3.0 i36f 2 bestdiast 70 % · axial · 0.44mm/px · z∈[-293,-176]mm · 8 of 51 slices shown, 10 images]
[im 6/51  vessel]
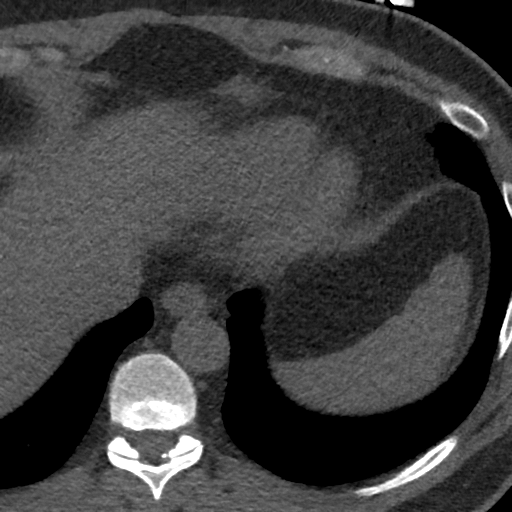
[im 6/51  lung]
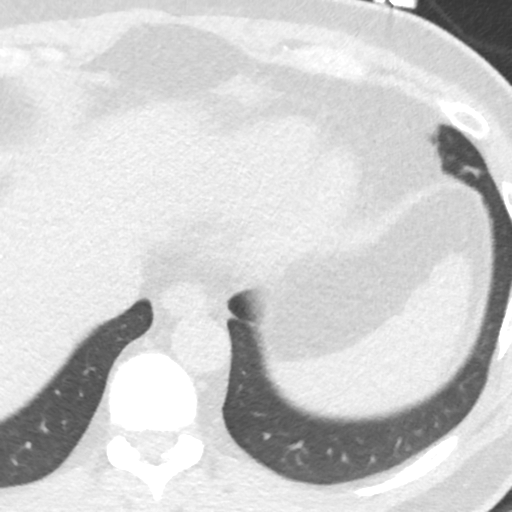
[im 12/51  vessel]
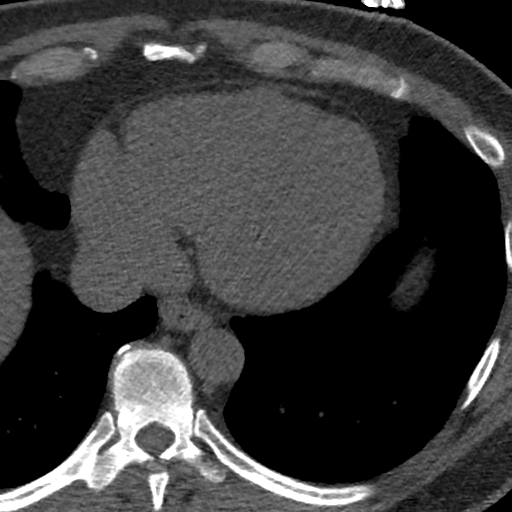
[im 17/51  vessel]
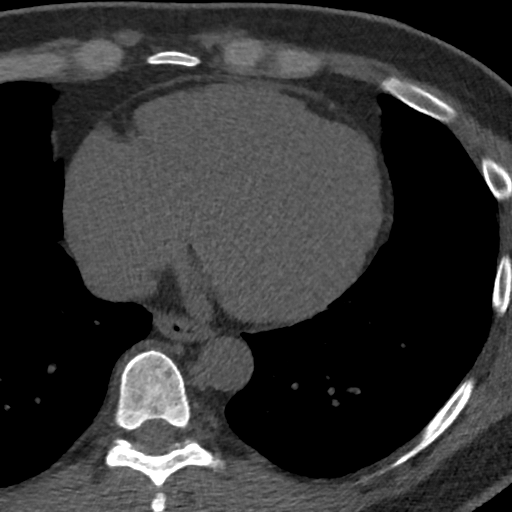
[im 23/51  vessel]
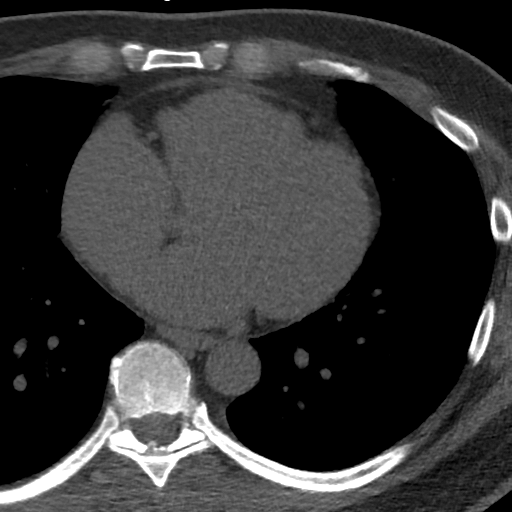
[im 28/51  vessel]
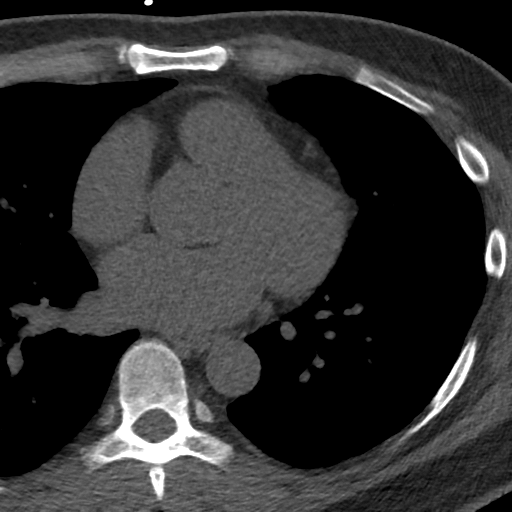
[im 28/51  lung]
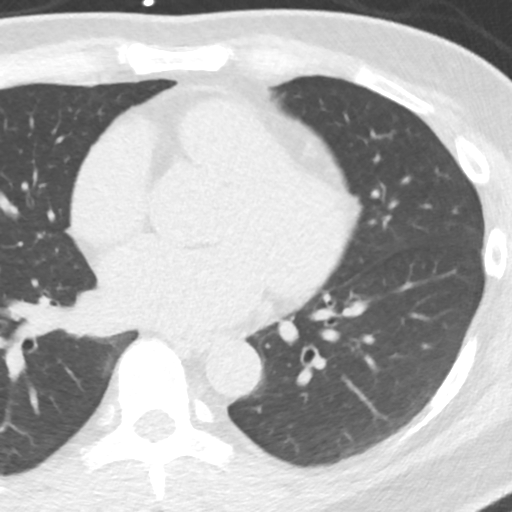
[im 34/51  vessel]
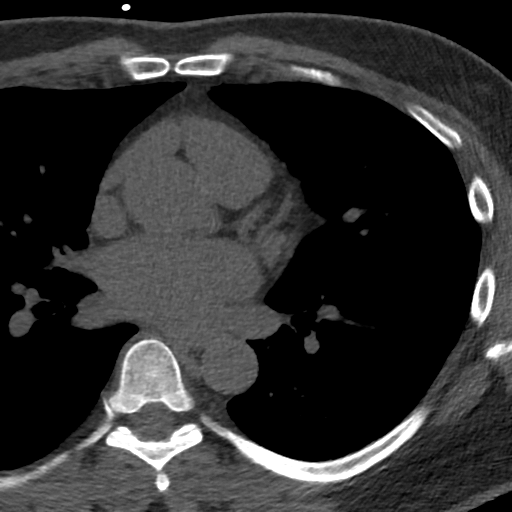
[im 39/51  vessel]
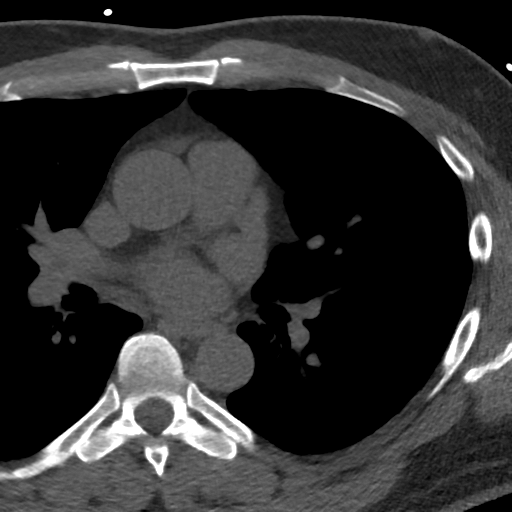
[im 45/51  vessel]
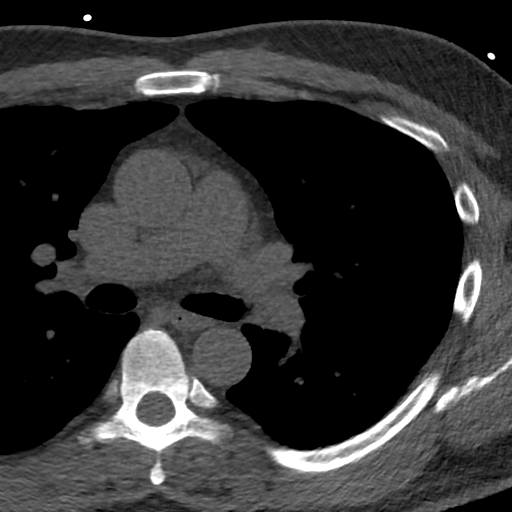

[Series 4: lung st 70 % · axial · 0.72mm/px · z∈[-292,-176]mm · 8 of 51 slices shown]
[im 6/51  lung]
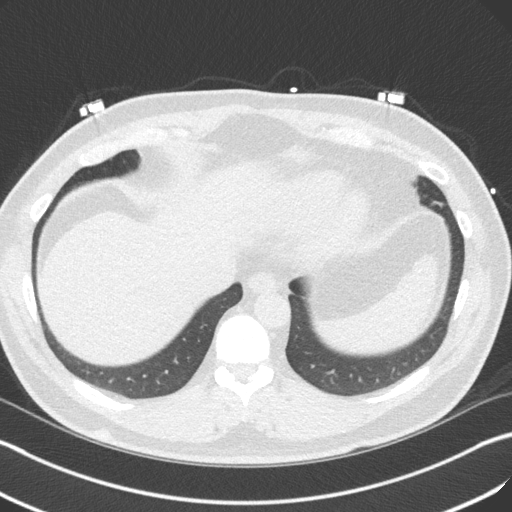
[im 12/51  lung]
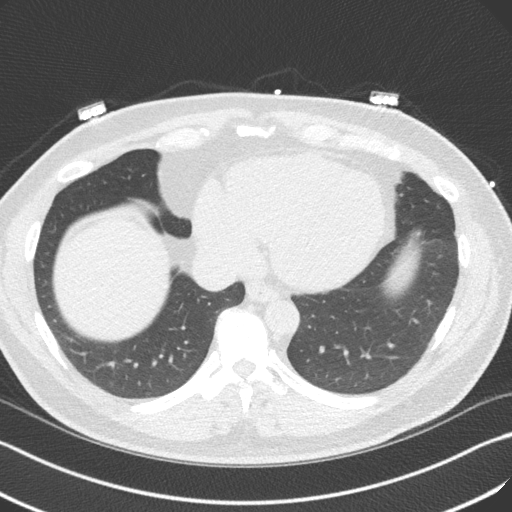
[im 17/51  lung]
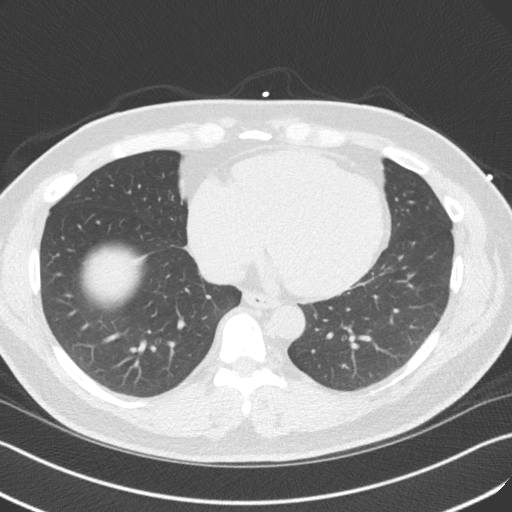
[im 23/51  lung]
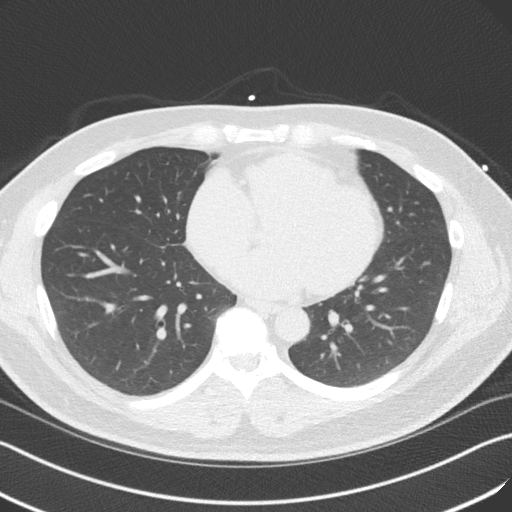
[im 28/51  lung]
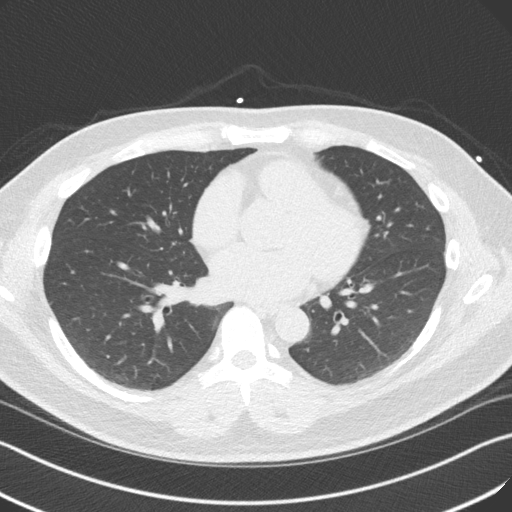
[im 34/51  lung]
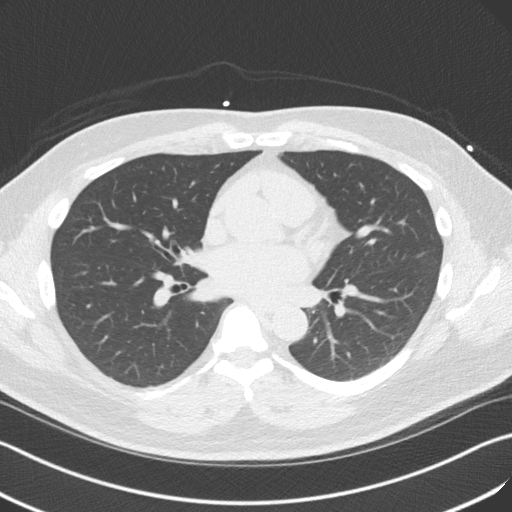
[im 39/51  lung]
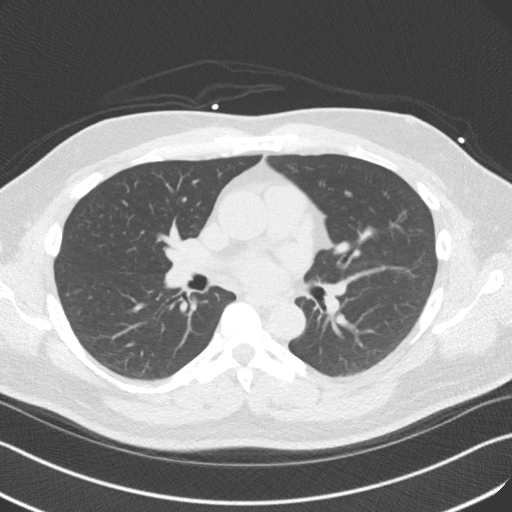
[im 45/51  lung]
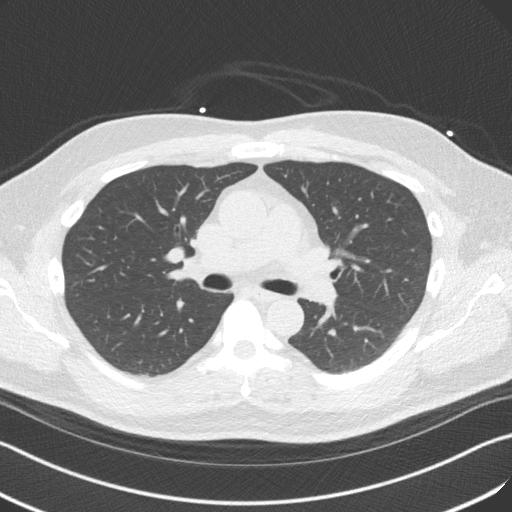

[16 of 20 positions shown; findings below may reference images not displayed]

FINDINGS: Within the visualized portions of the thorax there are no suspicious
appearing pulmonary nodules or masses, there is no acute
consolidative airspace disease, no pleural effusions, no
pneumothorax and no lymphadenopathy. Visualized portions of the
upper abdomen are unremarkable. There are no aggressive appearing
lytic or blastic lesions noted in the visualized portions of the
skeleton.
IMPRESSION: No significant incidental noncardiac findings are noted
FINDINGS: Non-cardiac: See separate report from [REDACTED].

Ascending Aorta: Normal size, no calcifications.

Pericardium: Normal.

Coronary arteries: Normal origin.
IMPRESSION: Coronary calcium score of 0. This was 0 percentile for age and sex
matched control.

*** End of Addendum ***
EXAM:
OVER-READ INTERPRETATION  CT CHEST

The following report is an over-read performed by radiologist Dr.
Drahimov Decorlar [REDACTED] on 03/04/2019. This
over-read does not include interpretation of cardiac or coronary
anatomy or pathology. The coronary calcium score interpretation by
the cardiologist is attached.
FINDINGS: Within the visualized portions of the thorax there are no suspicious
appearing pulmonary nodules or masses, there is no acute
consolidative airspace disease, no pleural effusions, no
pneumothorax and no lymphadenopathy. Visualized portions of the
upper abdomen are unremarkable. There are no aggressive appearing
lytic or blastic lesions noted in the visualized portions of the
skeleton.
IMPRESSION: No significant incidental noncardiac findings are noted

## 2020-03-17 ENCOUNTER — Other Ambulatory Visit: Payer: Self-pay | Admitting: Oral Surgery

## 2021-01-19 ENCOUNTER — Encounter: Payer: BC Managed Care – PPO | Admitting: Physical Therapy

## 2021-02-02 ENCOUNTER — Other Ambulatory Visit: Payer: Self-pay

## 2021-02-02 ENCOUNTER — Ambulatory Visit: Payer: BC Managed Care – PPO | Attending: Family Medicine

## 2021-02-02 DIAGNOSIS — H8112 Benign paroxysmal vertigo, left ear: Secondary | ICD-10-CM | POA: Diagnosis present

## 2021-02-02 DIAGNOSIS — R42 Dizziness and giddiness: Secondary | ICD-10-CM | POA: Diagnosis not present

## 2021-02-02 NOTE — Therapy (Signed)
[facility] [address] [place], [place], [address] Phone: [phone]   Fax:  [phone]  Physical Therapy Evaluation  Patient Details  Name: KONSTANTIN LEHNEN MRN: [id number] Date of Birth: 1956/04/09 Referring Provider (PT): Daisy Floro, MD   Encounter Date: 02/02/2021   PT End of Session - 02/02/21 1349     Visit Number 1    Number of Visits 5    Date for PT Re-Evaluation 03/02/21    Authorization Type BCBS    PT Start Time 1350    PT Stop Time 1434    PT Time Calculation (min) 44 min    Activity Tolerance Patient tolerated treatment well    Behavior During Therapy [facility] for tasks assessed/performed             Past Medical History:  Diagnosis Date   BPH (benign prostatic hyperplasia)    Hypertension    Normal coronary arteries    by cardiac catheterization performed by Dr. Danise Mina    Sleep apnea    compliant with cpap    Past Surgical History:  Procedure Laterality Date   HERNIA REPAIR     2 ingunial, 1 in belly button   KNEE SURGERY     4 surgeries   SHOULDER SURGERY     both     There were no vitals filed for this visit.    Subjective Assessment - 02/02/21 1349     Subjective Patient reports that he had a R Knee replacement approx 1 month ago and woke up with Vertigo. Reports that believed it was getting better, but then on Tuesday rolled onto his side and the spinning sensation started. Has not treated at home. Treated for BPPV at this facility in 2020, 2021. Currently recieving PT services for the knee. Has taken a valium yesterday. Patient reports when the spinning sensation hits it is lasting approx 30 seconds.    Pertinent History BPH, HTN, Sleep Apnea, Essential Tremor    Limitations Standing;Walking    Patient Stated Goals Resolve the Vertigo    Currently in Pain? Yes    Pain Score 6     Pain Location Knee    Pain Orientation Right    Pain Descriptors / Indicators  Sharp    Pain Type Acute pain    Pain Onset More than a month ago    Pain Frequency Intermittent                [facility] PT Assessment - 02/02/21 0001       Assessment   Medical Diagnosis Dizziness    Referring Provider (PT) Daisy Floro, MD    Prior Therapy Treated for BPPV at this facility in 2020, 2021      Precautions   Precautions None      Restrictions   Weight Bearing Restrictions Yes    LLE Weight Bearing Weight bearing as tolerated    Other Position/Activity Restrictions recent knee replacement      Balance Screen   Has the patient fallen in the past 6 months No    Has the patient had a decrease in activity level because of a fear of falling?  No    Is the patient reluctant to leave their home because of a fear of falling?  No      Home Environment   Living Environment Private residence    Living Arrangements Spouse/significant other    Available Help at Discharge Family  Type of Home House      Prior Function   Level of Independence Independent    Vocation Full time employment    Vocation Requirements Salesman      Cognition   Overall Cognitive Status Within Functional Limits for tasks assessed      Observation/Other Assessments   Focus on Therapeutic Outcomes (FOTO)  DPS: 43 DFS: 34.6      Sensation   Light Touch Appears Intact      Coordination   Gross Motor Movements are Fluid and Coordinated Yes      Transfers   Transfers Sit to Stand;Stand to Sit    Sit to Stand 7: Independent    Stand to Sit 7: Independent      Ambulation/Gait   Ambulation/Gait Yes    Ambulation/Gait Assistance 7: Independent    Assistive device None    Gait Pattern Antalgic   due to recent knee replacement   Ambulation Surface Level;Indoor                    Vestibular Assessment - 02/02/21 0001       Symptom Behavior   Subjective history of current problem see subjective    Type of Dizziness  Spinning;Vertigo    Frequency of Dizziness daily     Duration of Dizziness seconds to minutes    Symptom Nature Positional;Intermittent    Aggravating Factors Rolling to left;Forward bending;Looking up to the ceiling    Relieving Factors Slow movements;Head stationary    Progression of Symptoms No change since onset      Oculomotor Exam   Oculomotor Alignment Normal    Ocular ROM WNL    Spontaneous Absent    Gaze-induced  Absent    Smooth Pursuits Intact    Saccades Intact      Positional Testing   Dix-Hallpike Dix-Hallpike Right;Dix-Hallpike Left    Sidelying Test Sidelying Right;Sidelying Left    Horizontal Canal Testing Horizontal Canal Right;Horizontal Canal Left      Dix-Hallpike Right   Dix-Hallpike Right Duration 0    Dix-Hallpike Right Symptoms No nystagmus      Dix-Hallpike Left   Dix-Hallpike Left Duration 30 seconds    Dix-Hallpike Left Symptoms Upbeat, left rotatory nystagmus      Sidelying Right   Sidelying Right Duration 0    Sidelying Right Symptoms No nystagmus      Sidelying Left   Sidelying Left Duration 15 seconds    Sidelying Left Symptoms Upbeat, left rotatory nystagmus      Horizontal Canal Right   Horizontal Canal Right Duration 0    Horizontal Canal Right Symptoms Normal      Horizontal Canal Left   Horizontal Canal Left Duration 45   L R   Horizontal Canal Left Symptoms Other (comment)   R Rotary               Objective measurements completed on examination: See above findings.        Vestibular Treatment/Exercise - 02/02/21 0001       Vestibular Treatment/Exercise   Vestibular Treatment Provided Canalith Repositioning    Canalith Repositioning Epley Manuever Left;Semont Procedure Right Posterior       EPLEY MANUEVER LEFT   Number of Reps  2    Overall Response  Improved Symptoms      Semont Procedure Right Posterior   Number of Reps  1    Overall Response  Improved Symptoms    Response Details  reduced duration  symptoms/nystagmus                   PT  Education - 02/02/21 1359     Education Details Educated on [place]/Evaluation Findings; Reoccurence of BPPV    Person(s) Educated Patient    Methods Explanation    Comprehension Verbalized understanding              PT Short Term Goals - 02/02/21 1349       PT SHORT TERM GOAL #1   Title = LTGs               PT Long Term Goals - 02/02/21 1431       PT LONG TERM GOAL #1   Title Pt will report no vertigo with bed mobility    Baseline vertigo with rolling    Time 4    Period Weeks    Status New    Target Date 03/02/21      PT LONG TERM GOAL #2   Title Pt will have (-) Lt Dix-Hallpike test to indicate Lt BPPV has resolved.    Baseline positive Lt Dix Hallpike    Time 4    Period Weeks    Status New      PT LONG TERM GOAL #3   Title Patient will improve DPS >/= 50    Baseline 43    Time 4    Period Weeks    Status New                    Plan - 02/02/21 1412     Clinical Impression Statement Patient is a 65 y.o. male referred to Neuro OPPT for Dizziness. PMH significant for the following: BPH, HTN, Sleep Apnea, Essential Tremor. Patient presents with the following upon evaluation: Dizziness and L Upbeating Nystagmus of short duration with positional testing indicinating reoccurrence of L posterior canal canalithiasis. Completed CRM x 2 reps with Epley, followed by x 1 with Semont wiht improvements noted. Paitent will benefit from skilled PT services to address impairments and improved tolernace for bed mobility.    Personal Factors and Comorbidities Comorbidity 3+    Comorbidities Patient is a 65 y.o. male referred to Neuro OPPT for Dizziness. PMH significant for the following: BPH, HTN, Sleep Apnea, Essential Tremor. Patient presents with the following upon evaluation: L Upbeating Nystagmus of short duration indicinating reoccurrence of L posterior canal canalithiasis. Completed CRM x 2 reps with    Examination-Activity Limitations Bed Mobility;Sleep;Bend     Examination-Participation Restrictions Occupation;Community Activity    Stability/Clinical Decision Making Stable/Uncomplicated    Clinical Decision Making Low    Rehab Potential Good    PT Frequency 1x / week    PT Duration 4 weeks    PT Treatment/Interventions ADLs/Self Care Home Management;Canalith Repostioning;Cryotherapy;Moist Heat;DME Instruction;Gait training;Stair training;Functional mobility training;Therapeutic activities;Therapeutic exercise;Balance training;Neuromuscular re-education;Vestibular;Dry needling;Patient/family education;Manual techniques;Passive range of motion    PT Next Visit Plan Reassess L BPPV and treat as indicated.    Consulted and Agree with Plan of Care Patient             Patient will benefit from skilled therapeutic intervention in order to improve the following deficits and impairments:  Dizziness, Decreased activity tolerance, Pain, Decreased balance  Visit Diagnosis: Dizziness and giddiness  BPPV (benign paroxysmal positional vertigo), left     Problem List Patient Active Problem List   Diagnosis Date Noted   Essential hypertension 02/21/2015   Dyspnea on exertion 02/21/2015  Obstructive sleep apnea 02/21/2015    Tempie Donning, PT, DPT 02/02/2021, 2:40 PM  [facility] [address] [place], [place], [address] Phone: [phone]   Fax:  [phone]  Name: BUREL KAHRE MRN: [id number] Date of Birth: 12-08-1955

## 2021-02-09 ENCOUNTER — Other Ambulatory Visit: Payer: Self-pay

## 2021-02-09 ENCOUNTER — Ambulatory Visit: Payer: BC Managed Care – PPO | Attending: Family Medicine

## 2021-02-09 DIAGNOSIS — R2681 Unsteadiness on feet: Secondary | ICD-10-CM | POA: Diagnosis present

## 2021-02-09 DIAGNOSIS — H8112 Benign paroxysmal vertigo, left ear: Secondary | ICD-10-CM | POA: Insufficient documentation

## 2021-02-09 DIAGNOSIS — R42 Dizziness and giddiness: Secondary | ICD-10-CM | POA: Insufficient documentation

## 2021-02-09 NOTE — Patient Instructions (Signed)
Access Code: [email or web address] URL: [email or web address] Date: 02/09/2021 Prepared by: Jethro Bastos  Patient Education Left Epley Maneuver

## 2021-02-09 NOTE — Therapy (Signed)
[facility] [address] [place], [place], [address] Phone: [phone]   Fax:  [phone]  Physical Therapy Treatment  Patient Details  Name: [name] MRN: [id number] Date of Birth: [birth year] Referring Provider (PT): Daisy Floro, MD   Encounter Date: 02/09/2021   PT End of Session - 02/09/21 0932     Visit Number 2    Number of Visits 5    Date for PT Re-Evaluation 03/02/21    Authorization Type BCBS    PT Start Time 0932    PT Stop Time 0947    PT Time Calculation (min) 15 min    Activity Tolerance Patient tolerated treatment well    Behavior During Therapy [facility] for tasks assessed/performed             Past Medical History:  Diagnosis Date   BPH (benign prostatic hyperplasia)    Hypertension    Normal coronary arteries    by cardiac catheterization performed by Dr. Danise Mina    Sleep apnea    compliant with cpap    Past Surgical History:  Procedure Laterality Date   HERNIA REPAIR     2 ingunial, 1 in belly button   KNEE SURGERY     4 surgeries   SHOULDER SURGERY     both     There were no vitals filed for this visit.   Subjective Assessment - 02/09/21 0934     Subjective Patient reports that has not felt the dizziness/spinning sensation, but has not tried to test it. Patient reports sleep in the bed for the first time last night and did not have any symptoms.    Pertinent History BPH, HTN, Sleep Apnea, Essential Tremor    Limitations Standing;Walking    Patient Stated Goals Resolve the Vertigo    Currently in Pain? No/denies    Pain Onset More than a month ago              Vestibular Assessment - 02/09/21 0001       Positional Testing   Dix-Hallpike Dix-Hallpike Left;Dix-Hallpike Right    Horizontal Canal Testing Horizontal Canal Right;Horizontal Canal Left      Dix-Hallpike Right   Dix-Hallpike Right Duration 0    Dix-Hallpike Right Symptoms No nystagmus       Dix-Hallpike Left   Dix-Hallpike Left Duration 0    Dix-Hallpike Left Symptoms No nystagmus      Sidelying Right   Sidelying Right Duration 0    Sidelying Right Symptoms No nystagmus      Sidelying Left   Sidelying Left Duration 0    Sidelying Left Symptoms No nystagmus      Horizontal Canal Right   Horizontal Canal Right Duration 0    Horizontal Canal Right Symptoms Normal      Horizontal Canal Left   Horizontal Canal Left Duration 0    Horizontal Canal Left Symptoms Normal              [facility] Adult PT Treatment/Exercise - 02/09/21 0001       Therapeutic Activites    Therapeutic Activities Other Therapeutic Activities    Other Therapeutic Activities PT educating on L Epley Manuever for Self Managment of symptoms in case of re-occurence upon d/c             Access Code: [email or web address] URL: [email or web address] Date: 02/09/2021 Prepared by: Jethro Bastos  Patient Education Left Epley Maneuver   PT Education - 02/09/21 [phone]  Education Details L Epley; Will keep one follow up appointment if Needed,    Person(s) Educated Patient    Methods Explanation;Demonstration    Comprehension Verbalized understanding;Returned demonstration              PT Short Term Goals - 02/02/21 1349       PT SHORT TERM GOAL #1   Title = LTGs               PT Long Term Goals - 02/09/21 0951       PT LONG TERM GOAL #1   Title Pt will report no vertigo with bed mobility    Baseline vertigo with rolling; reports no vertigo with activities on 8/4    Time 4    Period Weeks    Status Achieved      PT LONG TERM GOAL #2   Title Pt will have (-) Lt Dix-Hallpike test to indicate Lt BPPV has resolved.    Baseline positive Lt Dix Hallpike; (-) L dix hallpike on 02/09/21    Time 4    Period Weeks    Status Achieved      PT LONG TERM GOAL #3   Title Patient will improve DPS >/= 50    Baseline 43    Time 4    Period Weeks    Status New                    Plan - 02/09/21 0949     Clinical Impression Statement Completed reassessment of L BPPV with patient demonstrating negative positional testing. Assessed all canals bilaterally with no nystagmus or symptoms today indicating resolution of L Posterior Canal Canalithiasis. PT educating on L Epley for self management of symptoms in case of re-occurence upon d/c. Planned to keep one follow up visit prior to d/c if needed due to high rate of reoccurence for this patient in past.    Personal Factors and Comorbidities Comorbidity 3+    Comorbidities Patient is a 65 y.o. male referred to Neuro OPPT for Dizziness. PMH significant for the following: BPH, HTN, Sleep Apnea, Essential Tremor. Patient presents with the following upon evaluation: L Upbeating Nystagmus of short duration indicinating reoccurrence of L posterior canal canalithiasis. Completed CRM x 2 reps with    Examination-Activity Limitations Bed Mobility;Sleep;Bend    Examination-Participation Restrictions Occupation;Community Activity    Stability/Clinical Decision Making Stable/Uncomplicated    Rehab Potential Good    PT Frequency 1x / week    PT Duration 4 weeks    PT Treatment/Interventions ADLs/Self Care Home Management;Canalith Repostioning;Cryotherapy;Moist Heat;DME Instruction;Gait training;Stair training;Functional mobility training;Therapeutic activities;Therapeutic exercise;Balance training;Neuromuscular re-education;Vestibular;Dry needling;Patient/family education;Manual techniques;Passive range of motion    PT Next Visit Plan Reassess L BPPV if needed.    Consulted and Agree with Plan of Care Patient             Patient will benefit from skilled therapeutic intervention in order to improve the following deficits and impairments:  Dizziness, Decreased activity tolerance, Pain, Decreased balance  Visit Diagnosis: Dizziness and giddiness  BPPV (benign paroxysmal positional vertigo), left  Unsteadiness on  feet     Problem List Patient Active Problem List   Diagnosis Date Noted   Essential hypertension 02/21/2015   Dyspnea on exertion 02/21/2015   Obstructive sleep apnea 02/21/2015    Tempie Donning, PT, DPT 02/09/2021, 9:52 AM  [facility] [address] [place], [place], [address] Phone: [phone]   Fax:  [phone]  Name: MARQUEST GUNKEL MRN: [id number] Date of Birth: 06/09/1956

## 2021-02-14 ENCOUNTER — Encounter: Payer: BC Managed Care – PPO | Admitting: Physical Therapy

## 2021-03-01 ENCOUNTER — Ambulatory Visit: Payer: BC Managed Care – PPO

## 2022-05-18 ENCOUNTER — Telehealth: Payer: Self-pay | Admitting: Cardiology

## 2022-05-18 NOTE — Telephone Encounter (Signed)
The patient is using a  [employer]. The device showed Afib this morning, then back to sinus rhythm. The patient noted he has no symptoms. Advised I will but him on the wait list for newpt appointment . Appt is currently with Dr. Anne Fu 06/05/22. Advised the patient to call Dr. Tenny Craw his PCP with this information and make sure he doesn't need any new treatment while he is waiting for the appointment with our office. Verbalized understanding and agreement. Will forward to Dr. Anne Fu and his RN.

## 2022-05-18 NOTE — Telephone Encounter (Signed)
Patient c/o Palpitations:  High priority if patient c/o lightheadedness, shortness of breath, or chest pain  How long have you had palpitations/irregular HR/ Afib? Are you having the symptoms now?   No  Are you currently experiencing lightheadedness, SOB or CP?  No  Do you have a history of afib (atrial fibrillation) or irregular heart rhythm?   No  Have you checked your BP or HR? (document readings if available):   HR low 60's this morning  Are you experiencing any other symptoms? No   Patient stated this morning after he got up he got reading that he is in afib.

## 2022-06-05 ENCOUNTER — Encounter: Payer: Self-pay | Admitting: Cardiology

## 2022-06-05 ENCOUNTER — Ambulatory Visit: Payer: BC Managed Care – PPO | Attending: Cardiology | Admitting: Cardiology

## 2022-06-05 ENCOUNTER — Ambulatory Visit (INDEPENDENT_AMBULATORY_CARE_PROVIDER_SITE_OTHER): Payer: BC Managed Care – PPO

## 2022-06-05 VITALS — BP 112/78 | HR 58 | Ht 70.0 in | Wt 214.0 lb

## 2022-06-05 DIAGNOSIS — R9431 Abnormal electrocardiogram [ECG] [EKG]: Secondary | ICD-10-CM | POA: Diagnosis not present

## 2022-06-05 DIAGNOSIS — R002 Palpitations: Secondary | ICD-10-CM

## 2022-06-05 NOTE — Progress Notes (Unsigned)
Enrolled for [employer] to mail a ZIO XT long term holter monitor to the patients address on file.  

## 2022-06-05 NOTE — Progress Notes (Signed)
Cardiology Office Note:    Date:  06/08/2022   ID:  [name], DOB 03-11-1956, MRN [id number]  PCP:  [name], [facility] Cardiologist:  Douglas Breeding, MD     Referring MD: Douglas Cruel, MD     History of Present Illness:    [name] is a 66 y.o. male seen for evaluation of an irregular heart rate.   Today, he reports that he has been feeling some skipped beats. He was feeling very fatigued and not well. He took out a home monitor that had been recommended to him and took a reading to see if it was abnormal and sent it to his doctor. They recommended that he come here to make sure there are no underlying issues.   Every once in awhile he will feel his heart race for a few beats and then it will go back to normal. He denies any other symptoms associated with these episodes.   He has been experiencing more anxiety and stress lately due to his sister passing recently from a stroke.   His mother did have a history of heart issues and his sister had recently had a pacemaker placed.   He denies any chest pain, shortness of breath, or peripheral edema. No lightheadedness, headaches, syncope, orthopnea, or PND.     Past Medical History:  Diagnosis Date   Abnormal fasting glucose    Actinic keratoses    Allergic rhinitis    Allergy-induced asthma    Asthma    BPH (benign prostatic hyperplasia)    Diverticulosis    DOE (dyspnea on exertion)    Hemorrhoids    History of angina    HLA B27 (HLA B27 positive)    HTN (hypertension)    Hypercholesterolemia    Hypertension    IBS (irritable bowel syndrome)    Insomnia    Irregular heart rate    Low back pain    Normal coronary arteries    by cardiac catheterization performed by Dr. Agapito Games    OSA (obstructive sleep apnea)    Other postherpetic nervous system involvement    PAC (premature atrial contraction)    Paresthesia    Sciatica    Sleep apnea     compliant with cpap   Spondylopathy    Vertigo    Vitamin D deficiency     Past Surgical History:  Procedure Laterality Date   HERNIA REPAIR     2 ingunial, 1 in belly button   KNEE SURGERY     4 surgeries   SHOULDER SURGERY     both     Current Medications: Current Meds  Medication Sig   amLODipine (NORVASC) 2.5 MG tablet Take 2.5 mg by mouth daily.   Cholecalciferol (VITAMIN D-1000 MAX ST) 25 MCG (1000 UT) tablet Take 1 tablet by mouth as directed.   Eszopiclone 3 MG TABS Take 1 tablet by mouth at bedtime.   finasteride (PROSCAR) 5 MG tablet Take 5 mg by mouth daily.   loperamide (IMODIUM A-D) 2 MG tablet Take 1 tablet by mouth as directed.   meloxicam (MOBIC) 7.5 MG tablet Take 7.5 mg by mouth 2 (two) times daily.   ramipril (ALTACE) 5 MG capsule Take 5 mg by mouth daily.   tamsulosin (FLOMAX) 0.4 MG CAPS capsule Take 0.8 mg by mouth at bedtime.   zaleplon (SONATA) 10 MG capsule Take 10 mg by mouth at bedtime as needed for sleep.  Allergies:   Patient has no known allergies.   Social History   Socioeconomic History   Marital status: Married    Spouse name: Not on file   Number of children: Not on file   Years of education: Not on file   Highest education level: Not on file  Occupational History   Not on file  Tobacco Use   Smoking status: Never   Smokeless tobacco: Never  Vaping Use   Vaping Use: Never used  Substance and Sexual Activity   Alcohol use: Yes    Comment: 1 drink/month   Drug use: No   Sexual activity: Not on file  Other Topics Concern   Not on file  Social History Narrative   Lives with wife.  Three sons.  Works IT sales professional.    Social Determinants of Radio broadcast assistant Strain: Not on file  Food Insecurity: Not on file  Transportation Needs: Not on file  Physical Activity: Not on file  Stress: Not on file  Social Connections: Not on file     Family History: The patient's family history includes Cancer in his father;  Diabetes in his maternal grandmother; Heart failure in his mother; Hypertension in his father; Stroke in his father and mother.  ROS:   Please see the history of present illness.    (+) palpitations (+) stress  All other systems reviewed and are negative.  EKGs/Labs/Other Studies Reviewed:    The following studies were reviewed today:  Coronary Calcium Scoring 03/04/2019: Coronary calcium score of 0. This was 0 percentile for age and sex matched control.  EKG:  EKG is personally reviewed.  06/05/2022: sinus bradycardia. 58 bpm  Recent Labs: No results found for requested labs within last 365 days.  Recent Lipid Panel    Component Value Date/Time   CHOL  10/08/2008 0315    152        ATP III CLASSIFICATION:  <200     mg/dL   Desirable  200-239  mg/dL   Borderline High  >=240    mg/dL   High          TRIG 126 10/08/2008 0315   HDL 33 (L) 10/08/2008 0315   CHOLHDL 4.6 10/08/2008 0315   VLDL 25 10/08/2008 0315   LDLCALC  10/08/2008 0315    94        Total Cholesterol/HDL:CHD Risk Coronary Heart Disease Risk Table                     Men   Women  1/2 Average Risk   3.4   3.3  Average Risk       5.0   4.4  2 X Average Risk   9.6   7.1  3 X Average Risk  23.4   11.0        Use the calculated Patient Ratio above and the CHD Risk Table to determine the patient's CHD Risk.        ATP III CLASSIFICATION (LDL):  <100     mg/dL   Optimal  100-129  mg/dL   Near or Above                    Optimal  130-159  mg/dL   Borderline  160-189  mg/dL   High  >190     mg/dL   Very High     Risk Assessment/Calculations:  Physical Exam:    VS:  BP 112/78 (BP Location: Left Arm, Patient Position: Sitting, Cuff Size: Normal)   Pulse (!) 58   Ht _0  (1.778 m)   Wt 214 lb (97.1 kg)   BMI 30.71 kg/m     Wt Readings from Last 3 Encounters:  06/05/22 214 lb (97.1 kg)  12/23/19 210 lb (95.3 kg)  01/29/19 209 lb (94.8 kg)     GEN:  Well nourished, well  developed in no acute distress HEENT: Normal NECK: No JVD; No carotid bruits LYMPHATICS: No lymphadenopathy CARDIAC: RRR, no murmurs, rubs, gallops RESPIRATORY:  Clear to auscultation without rales, wheezing or rhonchi  ABDOMEN: Soft, non-tender, non-distended MUSCULOSKELETAL:  No edema; No deformity  SKIN: Warm and dry NEUROLOGIC:  Alert and oriented x 3 PSYCHIATRIC:  Normal affect   ASSESSMENT:    1. Nonspecific abnormal electrocardiogram (ECG) (EKG)   2. Palpitations    PLAN:    In order of problems listed above:  Palpitations/abnormal EKG/paroxysmal atrial tachycardia/PACs - We will go ahead and check a Zio patch monitor for 14 days to ensure that he does not have any evidence of atrial fibrillation present.  His sister recently died of stroke.  She had had a pacemaker a few weeks prior. -Atrial tachycardia is benign but can be of an annoyance.  Conservative measures are decreasing caffeine, good sleep hygiene, daily exercise.  He showed me a few tracings from his cardia device.  He did catch 1 evidence of atrial tachycardia.  What was noted is possible atrial fibrillation is actually sinus rhythm.  Prevention - He had a calcium score in 2020 that was 0.  In 2 years or 5 years after the original, we would consider repeating the calcium score given his family history of stroke.         Follow Up: 2 years Medication Adjustments/Labs and Tests Ordered: Current medicines are reviewed at length with the patient today.  Concerns regarding medicines are outlined above.  Orders Placed This Encounter  Procedures   LONG TERM MONITOR (3-14 DAYS)   EKG 12-Lead   ECHOCARDIOGRAM COMPLETE   No orders of the defined types were placed in this encounter.   Patient Instructions  Medication Instructions:  Your physician recommends that you continue on your current medications as directed. Please refer to the Current Medication list given to you today.  *If you need a refill on your  cardiac medications before your next appointment, please call your pharmacy*  Lab Work: None. If you have labs (blood work) drawn today and your tests are completely normal, you will receive your results only by: [place] (if you have MyChart) OR A paper copy in the mail If you have any lab test that is abnormal or we need to change your treatment, we will call you to review the results.   Testing/Procedures: Your physician has requested that you have an echocardiogram. Echocardiography is a painless test that uses sound waves to create images of your heart. It provides your doctor with information about the size and shape of your heart and how well your heart's chambers and valves are working. This procedure takes approximately one hour. There are no restrictions for this procedure. Please do NOT wear cologne, perfume, aftershave, or lotions (deodorant is allowed). Please arrive 15 minutes prior to your appointment time.  ZIO XT- Long Term Monitor Instructions  Your physician has requested you wear a ZIO patch monitor for 14 days.  This is a single patch  monitor. [employer] supplies one patch monitor per enrollment. Additional stickers are not available. Please do not apply patch if you will be having a Nuclear Stress Test,  Echocardiogram, Cardiac CT, MRI, or Chest Xray during the period you would be wearing the  monitor. The patch cannot be worn during these tests. You cannot remove and re-apply the  ZIO XT patch monitor.  Your ZIO patch monitor will be mailed 3 day USPS to your address on file. It may take 3-5 days  to receive your monitor after you have been enrolled.  Once you have received your monitor, please review the enclosed instructions. Your monitor  has already been registered assigning a specific monitor serial # to you.  Billing and Patient Assistance Program Information  We have supplied [employer] with any of your insurance information on file for billing  purposes. [employer] offers a sliding scale Patient Assistance Program for patients that do not have  insurance, or whose insurance does not completely cover the cost of the ZIO monitor.  You must apply for the Patient Assistance Program to qualify for this discounted rate.  To apply, please call [employer] at [phone], select option 4, select option 2, ask to apply for  Patient Assistance Program. Theodore Demark will ask your household income, and how many people  are in your household. They will quote your out-of-pocket cost based on that information.  [employer] will also be able to set up a 54-month interest-free payment plan if needed.  Applying the monitor   Shave hair from upper left chest.  Hold abrader disc by orange tab. Rub abrader in 40 strokes over the upper left chest as  indicated in your monitor instructions.  Clean area with 4 enclosed alcohol pads. Let dry.  Apply patch as indicated in monitor instructions. Patch will be placed under collarbone on left  side of chest with arrow pointing upward.  Rub patch adhesive wings for 2 minutes. Remove white label marked "1". Remove the white  label marked "2". Rub patch adhesive wings for 2 additional minutes.  While looking in a mirror, press and release button in center of patch. A small green light will  flash 3-4 times. This will be your only indicator that the monitor has been turned on.  Do not shower for the first 24 hours. You may shower after the first 24 hours.  Press the button if you feel a symptom. You will hear a small click. Record Date, Time and  Symptom in the Patient Logbook.  When you are ready to remove the patch, follow instructions on the last 2 pages of Patient  Logbook. Stick patch monitor onto the last page of Patient Logbook.  Place Patient Logbook in the blue and white box. Use locking tab on box and tape box closed  securely. The blue and white box has prepaid postage on it. Please place it in the mailbox as  soon  as possible. Your physician should have your test results approximately 7 days after the  monitor has been [facility]  Call [place]at [phone] you have questions regarding  your ZIO XT patch monitor. Call them immediately if you see an orange light blinking on your  monitor.  If your monitor falls off in less than 4 days, contact our Monitor department at [phone]  If your monitor becomes loose or falls off after 4 days call [employer] at [phone]  suggestions on securing your monitor   Follow-Up: [facility], you and your health needs are our priority.  As part of our continuing mission to provide you with exceptional heart care, we have created designated Provider Care Teams.  These Care Teams include your primary Cardiologist (physician) and Advanced Practice Providers (APPs -  Physician Assistants and Nurse Practitioners) who all work together to provide you with the care you need, when you need it.  We recommend signing up for the patient portal called "MyChart".  Sign up information is provided on this After Visit Summary.  MyChart is used to connect with patients for Virtual Visits (Telemedicine).  Patients are able to view lab/test results, encounter notes, upcoming appointments, etc.  Non-urgent messages can be sent to your provider as well.   To learn more about what you can do with MyChart, go to [email or web address].    Your next appointment:   2 year(s)  The format for your next appointment:   In Person  Provider:   Candee Furbish, MD    Important Information About Varnado Ford,acting as a scribe for Candee Furbish, MD.,have documented all relevant documentation on the behalf of Candee Furbish, MD,as directed by  Candee Furbish, MD while in the presence of Candee Furbish, MD.   I, Candee Furbish, MD, have reviewed all documentation for this visit. The documentation on 06/08/22 for the exam,  diagnosis, procedures, and orders are all accurate and complete.   Signed, Candee Furbish, MD  06/08/2022 6:58 AM    [facility]

## 2022-06-05 NOTE — Patient Instructions (Addendum)
Medication Instructions:  Your physician recommends that you continue on your current medications as directed. Please refer to the Current Medication list given to you today.  *If you need a refill on your cardiac medications before your next appointment, please call your pharmacy*  Lab Work: None. If you have labs (blood work) drawn today and your tests are completely normal, you will receive your results only by: MyChart Message (if you have MyChart) OR A paper copy in the mail If you have any lab test that is abnormal or we need to change your treatment, we will call you to review the results.   Testing/Procedures: Your physician has requested that you have an echocardiogram. Echocardiography is a painless test that uses sound waves to create images of your heart. It provides your doctor with information about the size and shape of your heart and how well your heart's chambers and valves are working. This procedure takes approximately one hour. There are no restrictions for this procedure. Please do NOT wear cologne, perfume, aftershave, or lotions (deodorant is allowed). Please arrive 15 minutes prior to your appointment time.  ZIO XT- Long Term Monitor Instructions  Your physician has requested you wear a ZIO patch monitor for 14 days.  This is a single patch monitor. [employer] supplies one patch monitor per enrollment. Additional stickers are not available. Please do not apply patch if you will be having a Nuclear Stress Test,  Echocardiogram, Cardiac CT, MRI, or Chest Xray during the period you would be wearing the  monitor. The patch cannot be worn during these tests. You cannot remove and re-apply the  ZIO XT patch monitor.  Your ZIO patch monitor will be mailed 3 day USPS to your address on file. It may take 3-5 days  to receive your monitor after you have been enrolled.  Once you have received your monitor, please review the enclosed instructions. Your monitor  has already been  registered assigning a specific monitor serial # to you.  Billing and Patient Assistance Program Information  We have supplied [employer] with any of your insurance information on file for billing purposes. [employer] offers a sliding scale Patient Assistance Program for patients that do not have  insurance, or whose insurance does not completely cover the cost of the ZIO monitor.  You must apply for the Patient Assistance Program to qualify for this discounted rate.  To apply, please call [employer] at [phone], select option 4, select option 2, ask to apply for  Patient Assistance Program. Meredeth Ide will ask your household income, and how many people  are in your household. They will quote your out-of-pocket cost based on that information.  [employer] will also be able to set up a 17-month, interest-free payment plan if needed.  Applying the monitor   Shave hair from upper left chest.  Hold abrader disc by orange tab. Rub abrader in 40 strokes over the upper left chest as  indicated in your monitor instructions.  Clean area with 4 enclosed alcohol pads. Let dry.  Apply patch as indicated in monitor instructions. Patch will be placed under collarbone on left  side of chest with arrow pointing upward.  Rub patch adhesive wings for 2 minutes. Remove white label marked "1". Remove the white  label marked "2". Rub patch adhesive wings for 2 additional minutes.  While looking in a mirror, press and release button in center of patch. A small green light will  flash 3-4 times. This will be your only indicator that the  monitor has been turned on.  Do not shower for the first 24 hours. You may shower after the first 24 hours.  Press the button if you feel a symptom. You will hear a small click. Record Date, Time and  Symptom in the Patient Logbook.  When you are ready to remove the patch, follow instructions on the last 2 pages of Patient  Logbook. Stick patch monitor onto the last page of Patient  Logbook.  Place Patient Logbook in the blue and white box. Use locking tab on box and tape box closed  securely. The blue and white box has prepaid postage on it. Please place it in the mailbox as  soon as possible. Your physician should have your test results approximately 7 days after the  monitor has been mailed back to [facility].  Call [facility] Customer Care at [phone] if you have questions regarding  your ZIO XT patch monitor. Call them immediately if you see an orange light blinking on your  monitor.  If your monitor falls off in less than 4 days, contact our Monitor department at [phone].  If your monitor becomes loose or falls off after 4 days call [employer] at [phone] for  suggestions on securing your monitor   Follow-Up: At [facility], you and your health needs are our priority.  As part of our continuing mission to provide you with exceptional heart care, we have created designated Provider Care Teams.  These Care Teams include your primary Cardiologist (physician) and Advanced Practice Providers (APPs -  Physician Assistants and Nurse Practitioners) who all work together to provide you with the care you need, when you need it.  We recommend signing up for the patient portal called "MyChart".  Sign up information is provided on this After Visit Summary.  MyChart is used to connect with patients for Virtual Visits (Telemedicine).  Patients are able to view lab/test results, encounter notes, upcoming appointments, etc.  Non-urgent messages can be sent to your provider as well.   To learn more about what you can do with MyChart, go to [email or web address].    Your next appointment:   2 year(s)  The format for your next appointment:   In Person  Provider:   Donato Schultz, MD    Important Information About [place]

## 2022-06-08 DIAGNOSIS — R002 Palpitations: Secondary | ICD-10-CM | POA: Diagnosis not present

## 2022-06-08 DIAGNOSIS — R9431 Abnormal electrocardiogram [ECG] [EKG]: Secondary | ICD-10-CM

## 2022-06-12 ENCOUNTER — Encounter: Payer: Self-pay | Admitting: Cardiology

## 2022-06-27 ENCOUNTER — Ambulatory Visit (HOSPITAL_COMMUNITY): Payer: BC Managed Care – PPO | Attending: Cardiovascular Disease

## 2022-06-27 DIAGNOSIS — R002 Palpitations: Secondary | ICD-10-CM | POA: Insufficient documentation

## 2022-06-27 DIAGNOSIS — R9431 Abnormal electrocardiogram [ECG] [EKG]: Secondary | ICD-10-CM | POA: Diagnosis not present

## 2022-06-27 LAB — ECHOCARDIOGRAM COMPLETE
Area-P 1/2: 2.62 cm2
S' Lateral: 3.2 cm

## 2022-07-06 ENCOUNTER — Telehealth: Payer: Self-pay | Admitting: Cardiology

## 2022-07-06 NOTE — Telephone Encounter (Signed)
Pt is requesting call back in regards to monitor results.  

## 2022-07-06 NOTE — Telephone Encounter (Signed)
Douglas Bathe, MD 07/01/2022  7:56 AM EST       Symptomatic atrial tachycardia episodes   Occasional PAC/rare PVC   No atrial fibrillation   Donato Schultz, MD    Spoke with patient regarding the following results. Patient made aware and patient verbalized understanding.   Patient would like to know if there would be any recommendations for the atrial tachycardia runs. Advised patient I would forward message to MD. Patient verbalized understanding.

## 2022-07-11 NOTE — Telephone Encounter (Signed)
Recommend conservative strategy for now (exercise, diet, decreased caffeine, good sleep). Could consider Toprol XL 25mg  once a day if he desires. Candee Furbish, MD    Pt is aware of the above information.  At this time he would prefer to not take any more medication than he already does.  He will attempt to treat with the measures above and call back if any further concerns.

## 2023-10-07 ENCOUNTER — Other Ambulatory Visit: Payer: Self-pay | Admitting: Family Medicine

## 2023-10-07 DIAGNOSIS — J3489 Other specified disorders of nose and nasal sinuses: Secondary | ICD-10-CM

## 2023-10-11 ENCOUNTER — Ambulatory Visit
Admission: RE | Admit: 2023-10-11 | Discharge: 2023-10-11 | Disposition: A | Discharge status: Home / Self Care | Source: Ambulatory Visit | Attending: Family Medicine | Admitting: Family Medicine

## 2023-10-11 DIAGNOSIS — J3489 Other specified disorders of nose and nasal sinuses: Secondary | ICD-10-CM

## 2024-06-18 ENCOUNTER — Other Ambulatory Visit: Payer: Self-pay

## 2024-06-18 ENCOUNTER — Ambulatory Visit
Admission: RE | Admit: 2024-06-18 | Discharge: 2024-06-18 | Disposition: A | Discharge status: Home / Self Care | Source: Ambulatory Visit

## 2024-06-18 DIAGNOSIS — R197 Diarrhea, unspecified: Secondary | ICD-10-CM
